# Patient Record
Sex: Female | Born: 1970
Health system: Southern US, Community
[De-identification: ages and names within clinical notes are randomized; demographics above are authoritative.]

## PROBLEM LIST (undated history)

## (undated) DIAGNOSIS — Z87440 Personal history of urinary (tract) infections: Secondary | ICD-10-CM

## (undated) DIAGNOSIS — Z141 Cystic fibrosis carrier: Secondary | ICD-10-CM

## (undated) DIAGNOSIS — N762 Acute vulvitis: Secondary | ICD-10-CM

## (undated) DIAGNOSIS — Z8619 Personal history of other infectious and parasitic diseases: Secondary | ICD-10-CM

## (undated) DIAGNOSIS — I10 Essential (primary) hypertension: Secondary | ICD-10-CM

## (undated) DIAGNOSIS — Z8269 Family history of other diseases of the musculoskeletal system and connective tissue: Secondary | ICD-10-CM

## (undated) DIAGNOSIS — R638 Other symptoms and signs concerning food and fluid intake: Secondary | ICD-10-CM

## (undated) DIAGNOSIS — T4145XA Adverse effect of unspecified anesthetic, initial encounter: Secondary | ICD-10-CM

## (undated) DIAGNOSIS — Z8249 Family history of ischemic heart disease and other diseases of the circulatory system: Secondary | ICD-10-CM

## (undated) DIAGNOSIS — IMO0002 Reserved for concepts with insufficient information to code with codable children: Secondary | ICD-10-CM

## (undated) DIAGNOSIS — E669 Obesity, unspecified: Secondary | ICD-10-CM

## (undated) DIAGNOSIS — E785 Hyperlipidemia, unspecified: Secondary | ICD-10-CM

## (undated) DIAGNOSIS — T8859XA Other complications of anesthesia, initial encounter: Secondary | ICD-10-CM

## (undated) HISTORY — DX: Family history of ischemic heart disease and other diseases of the circulatory system: Z82.49

## (undated) HISTORY — DX: Other complications of anesthesia, initial encounter: T88.59XA

## (undated) HISTORY — DX: Cystic fibrosis carrier: Z14.1

## (undated) HISTORY — PX: WISDOM TOOTH EXTRACTION: SHX21

## (undated) HISTORY — DX: Personal history of urinary (tract) infections: Z87.440

## (undated) HISTORY — DX: Reserved for concepts with insufficient information to code with codable children: IMO0002

## (undated) HISTORY — DX: Acute vulvitis: N76.2

## (undated) HISTORY — DX: Obesity, unspecified: E66.9

## (undated) HISTORY — DX: Hyperlipidemia, unspecified: E78.5

## (undated) HISTORY — DX: Personal history of other infectious and parasitic diseases: Z86.19

## (undated) HISTORY — DX: Adverse effect of unspecified anesthetic, initial encounter: T41.45XA

## (undated) HISTORY — DX: Essential (primary) hypertension: I10

## (undated) HISTORY — DX: Family history of other diseases of the musculoskeletal system and connective tissue: Z82.69

## (undated) HISTORY — DX: Other symptoms and signs concerning food and fluid intake: R63.8

---

## 2002-04-15 ENCOUNTER — Other Ambulatory Visit: Admission: RE | Admit: 2002-04-15 | Discharge: 2002-04-15 | Payer: Self-pay | Admitting: Obstetrics and Gynecology

## 2002-07-11 ENCOUNTER — Encounter: Payer: Self-pay | Admitting: Obstetrics and Gynecology

## 2002-07-11 ENCOUNTER — Ambulatory Visit (HOSPITAL_COMMUNITY): Admission: RE | Admit: 2002-07-11 | Discharge: 2002-07-11 | Payer: Self-pay | Admitting: Obstetrics and Gynecology

## 2002-07-25 ENCOUNTER — Ambulatory Visit (HOSPITAL_COMMUNITY): Admission: RE | Admit: 2002-07-25 | Discharge: 2002-07-25 | Payer: Self-pay | Admitting: Obstetrics and Gynecology

## 2002-07-25 ENCOUNTER — Encounter: Payer: Self-pay | Admitting: Obstetrics and Gynecology

## 2002-12-16 ENCOUNTER — Inpatient Hospital Stay (HOSPITAL_COMMUNITY): Admission: AD | Admit: 2002-12-16 | Discharge: 2002-12-20 | Payer: Self-pay | Admitting: Obstetrics and Gynecology

## 2002-12-17 ENCOUNTER — Encounter (INDEPENDENT_AMBULATORY_CARE_PROVIDER_SITE_OTHER): Payer: Self-pay

## 2002-12-21 ENCOUNTER — Encounter: Admission: RE | Admit: 2002-12-21 | Discharge: 2003-01-20 | Payer: Self-pay | Admitting: Obstetrics and Gynecology

## 2003-05-12 ENCOUNTER — Other Ambulatory Visit: Admission: RE | Admit: 2003-05-12 | Discharge: 2003-05-12 | Payer: Self-pay | Admitting: Obstetrics and Gynecology

## 2004-02-08 DIAGNOSIS — Z8619 Personal history of other infectious and parasitic diseases: Secondary | ICD-10-CM

## 2004-02-08 HISTORY — DX: Personal history of other infectious and parasitic diseases: Z86.19

## 2004-05-17 ENCOUNTER — Other Ambulatory Visit: Admission: RE | Admit: 2004-05-17 | Discharge: 2004-05-17 | Payer: Self-pay | Admitting: Obstetrics and Gynecology

## 2005-02-07 DIAGNOSIS — R87619 Unspecified abnormal cytological findings in specimens from cervix uteri: Secondary | ICD-10-CM

## 2005-02-07 DIAGNOSIS — IMO0002 Reserved for concepts with insufficient information to code with codable children: Secondary | ICD-10-CM

## 2005-02-07 HISTORY — DX: Reserved for concepts with insufficient information to code with codable children: IMO0002

## 2005-02-07 HISTORY — DX: Unspecified abnormal cytological findings in specimens from cervix uteri: R87.619

## 2005-03-18 ENCOUNTER — Other Ambulatory Visit: Admission: RE | Admit: 2005-03-18 | Discharge: 2005-03-18 | Payer: Self-pay | Admitting: Obstetrics and Gynecology

## 2006-04-27 DIAGNOSIS — N762 Acute vulvitis: Secondary | ICD-10-CM

## 2006-04-27 HISTORY — DX: Acute vulvitis: N76.2

## 2007-06-25 ENCOUNTER — Ambulatory Visit (HOSPITAL_COMMUNITY): Admission: RE | Admit: 2007-06-25 | Discharge: 2007-06-25 | Payer: Self-pay | Admitting: Obstetrics and Gynecology

## 2007-06-25 ENCOUNTER — Encounter (INDEPENDENT_AMBULATORY_CARE_PROVIDER_SITE_OTHER): Payer: Self-pay | Admitting: Obstetrics and Gynecology

## 2007-06-25 ENCOUNTER — Ambulatory Visit: Payer: Self-pay | Admitting: *Deleted

## 2007-07-06 ENCOUNTER — Ambulatory Visit (HOSPITAL_COMMUNITY): Admission: RE | Admit: 2007-07-06 | Discharge: 2007-07-06 | Payer: Self-pay | Admitting: Obstetrics and Gynecology

## 2007-08-10 ENCOUNTER — Inpatient Hospital Stay (HOSPITAL_COMMUNITY): Admission: AD | Admit: 2007-08-10 | Discharge: 2007-08-10 | Payer: Self-pay | Admitting: Obstetrics and Gynecology

## 2007-08-13 ENCOUNTER — Other Ambulatory Visit: Payer: Self-pay | Admitting: Obstetrics and Gynecology

## 2007-08-13 ENCOUNTER — Inpatient Hospital Stay (HOSPITAL_COMMUNITY): Admission: AD | Admit: 2007-08-13 | Discharge: 2007-08-16 | Payer: Self-pay | Admitting: Obstetrics and Gynecology

## 2010-04-22 IMAGING — US US FETAL BPP W/O NONSTRESS
1 series · 14 of 15 positions shown · non-contrast
Comparison: none

OBSTETRICAL ULTRASOUND:
 This ultrasound exam was performed in the [HOSPITAL] Ultrasound Department.  The OB US report was generated in the AS system, and faxed to the ordering physician.  This report is also available in [REDACTED] PACS.

[Series 1: us fetal bpp w/o nonstress · 0.30mm/px · 15 acquisitions, 14 frames shown]
[im 1/15]
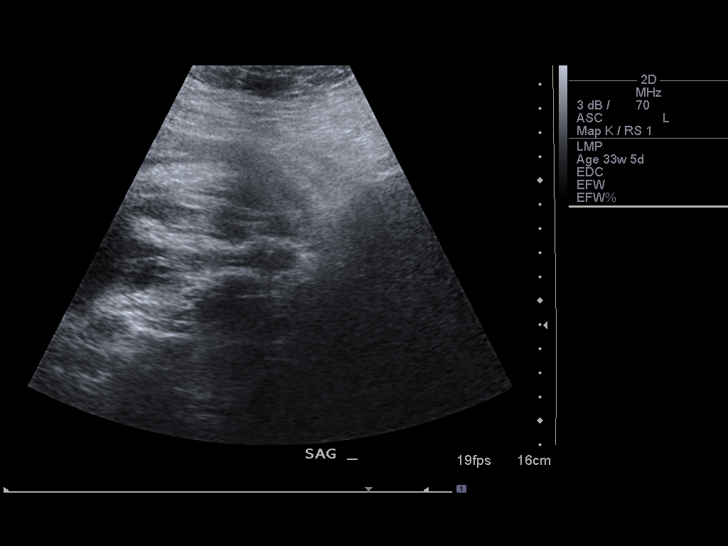
[im 2/15]
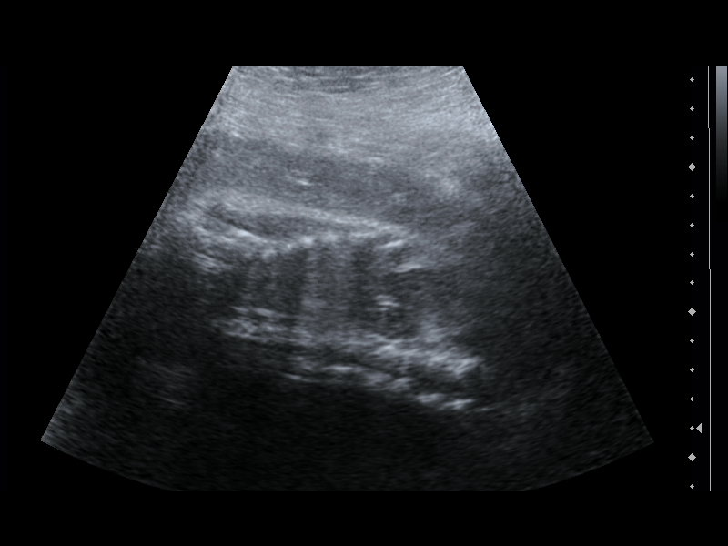
[im 3/15]
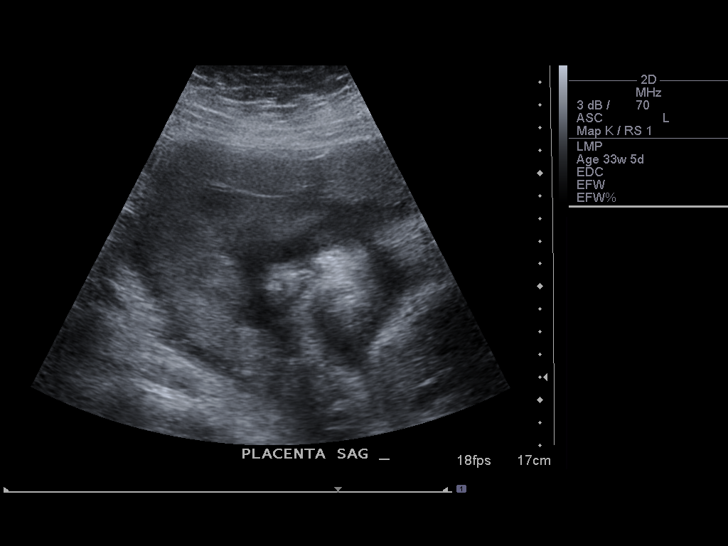
[im 4/15]
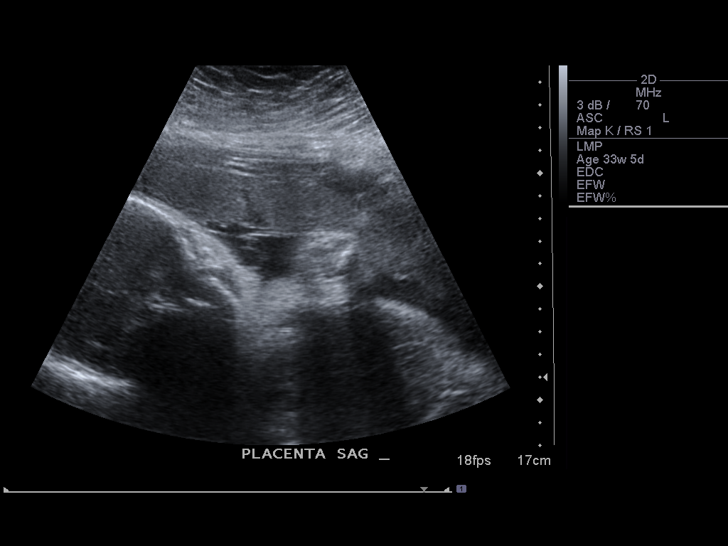
[im 5/15]
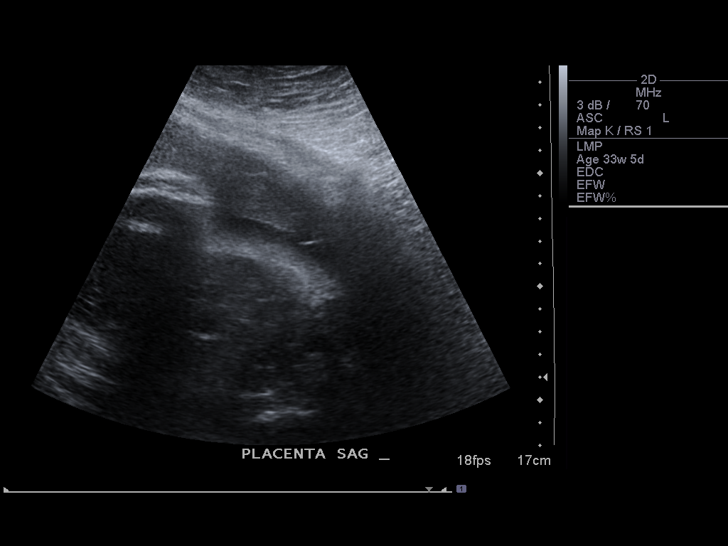
[im 6/15]
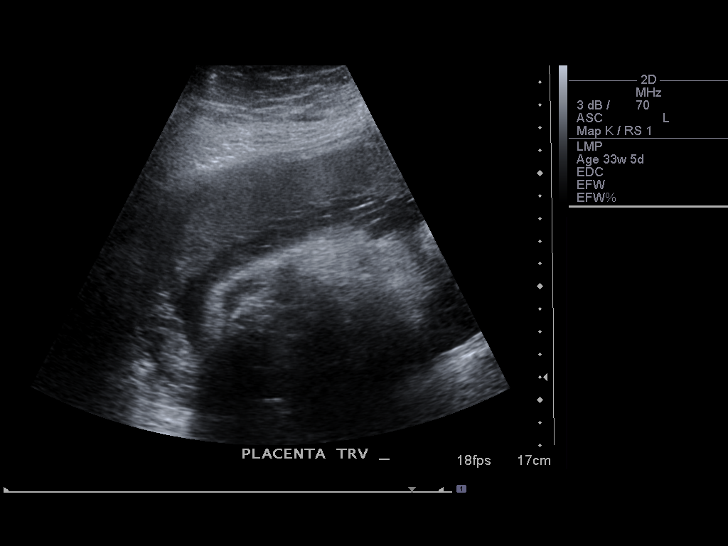
[im 7/15]
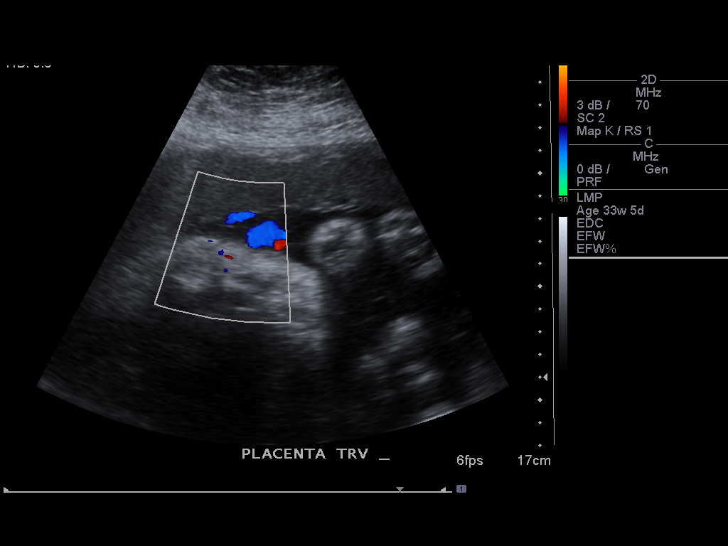
[im 9/15]
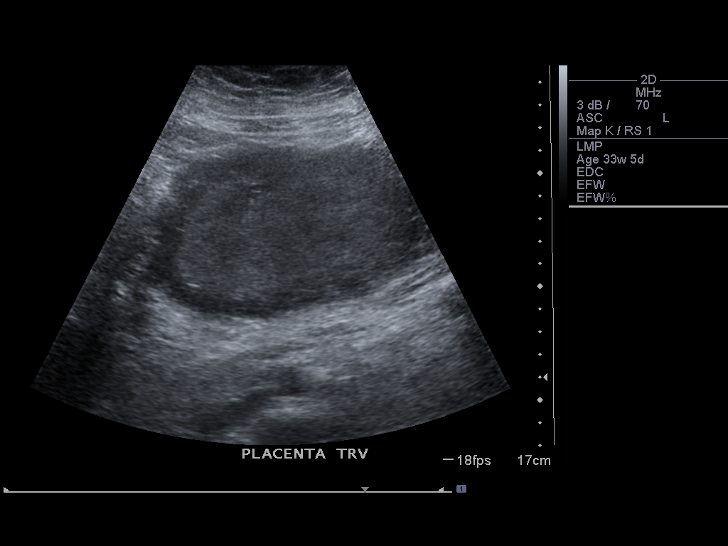
[im 10/15]
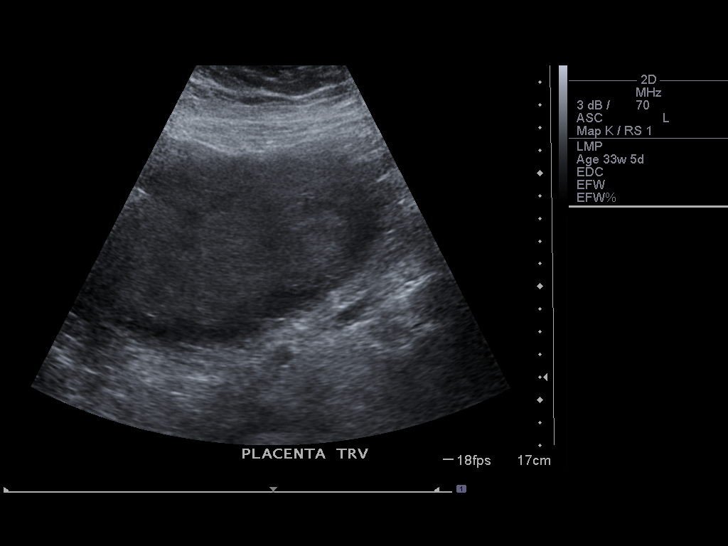
[im 11/15]
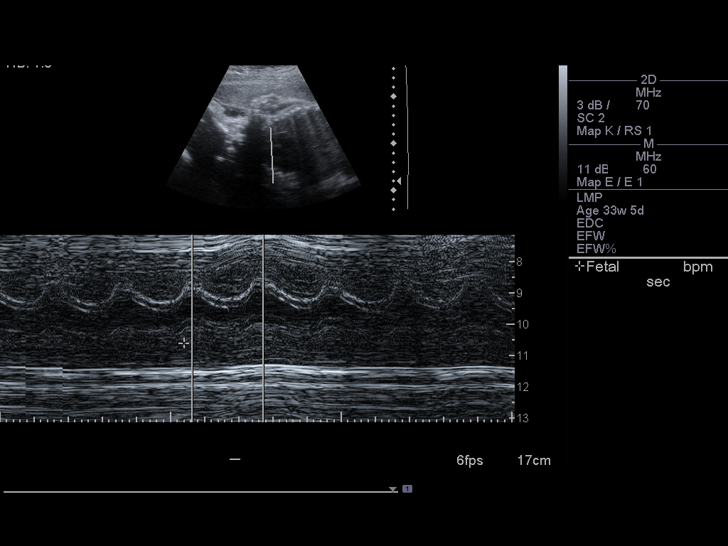
[im 12/15]
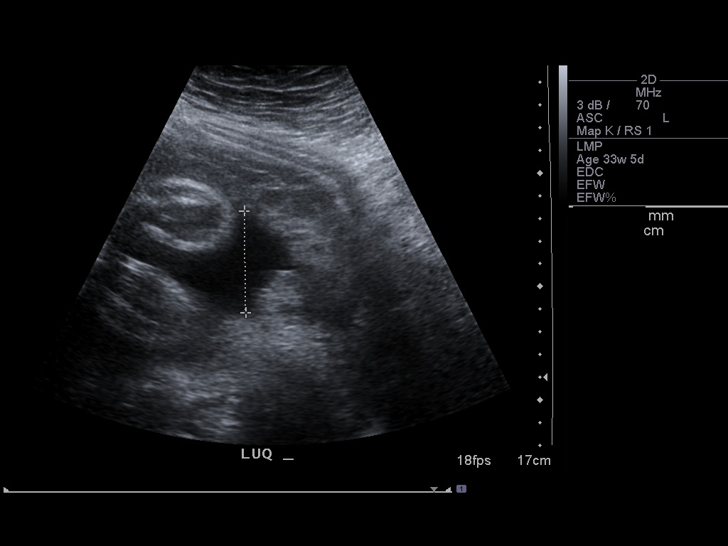
[im 13/15]
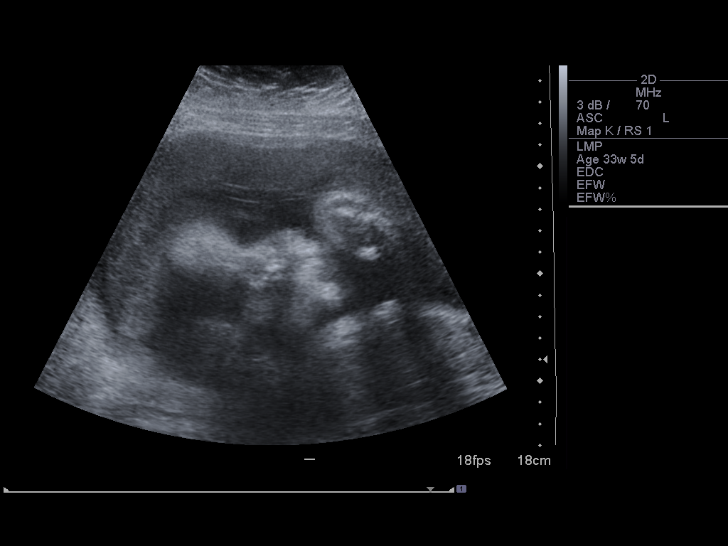
[im 14/15]
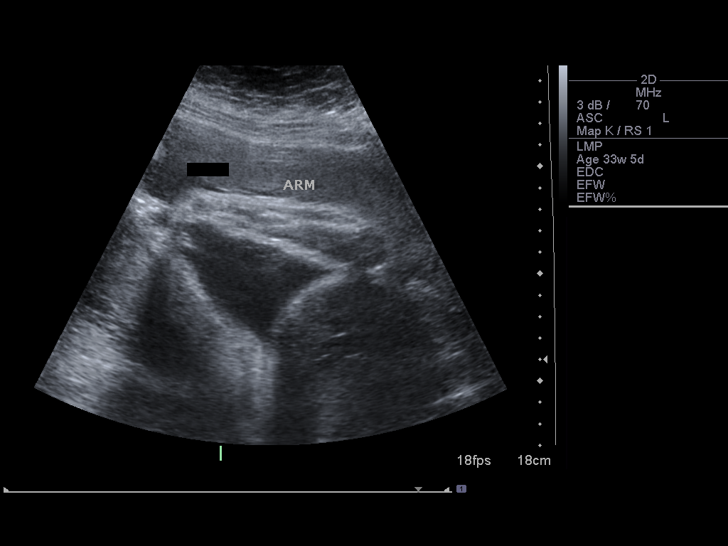
[im 15/15]
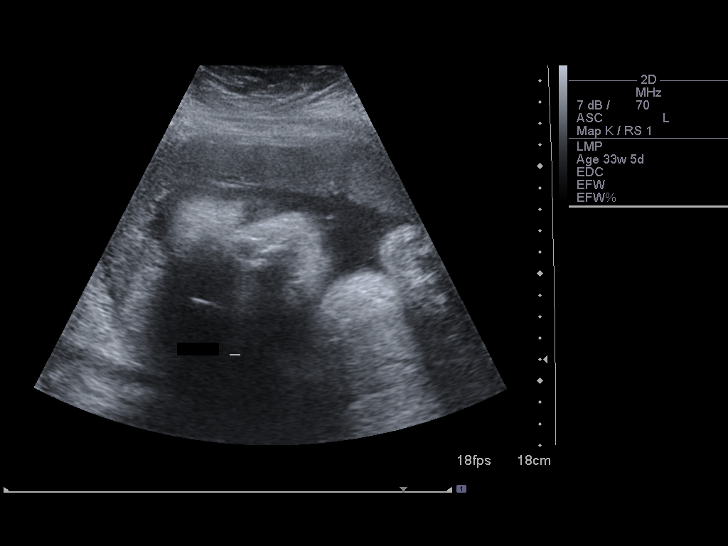

[14 of 15 positions shown; findings below may reference images not displayed]

IMPRESSION: See AS Obstetric US report.

## 2010-05-26 ENCOUNTER — Other Ambulatory Visit: Payer: Self-pay | Admitting: Obstetrics and Gynecology

## 2010-05-26 DIAGNOSIS — Z1231 Encounter for screening mammogram for malignant neoplasm of breast: Secondary | ICD-10-CM

## 2010-06-22 NOTE — H&P (Signed)
[name], [name]                ACCOUNT NO.:  [id number]   MEDICAL RECORD NO.:  [id number]          PATIENT TYPE:  MAT   LOCATION:  MATC                          [facility]   PHYSICIAN:  Hal Morales, M.D.DATE OF BIRTH:  [birth year]   DATE OF ADMISSION:  08/13/2007  DATE OF DISCHARGE:                              HISTORY & PHYSICAL   [name] is a 40 year old single black female gravida 3, para 1-0-1-1  at 39-1/7 weeks by an [facility] of August 19, 2007 who was sent to maternity  admissions unit secondary to nonreactive NST and elevated blood  pressure.  The patient was scheduled for a repeat low transverse  cesarean section on August 14, 2007.  Her blood pressures since arrival to  MAU have been in the range of systolic 134-151 over diastolic of 88-95.  She is without PIH signs or symptoms with exception of intermittent  visual disturbances.  She denies nausea, vomiting, diarrhea, UTI signs  or symptoms, shortness of breath, fever, cough, sore throat, or chest  pain.  She has been followed by M.D. service.  Dr. Su Hilt is her  primary physician at [facility].  Her history is remarkable for:  1. Chronic hypertension for which she has not been on any medications      during this pregnancy.  She had been previously prescribed      hydrochlorothiazide.  2. Previous C-section for nonreassuring fetal heart tracing with      desire for repeat.  3. 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  4. Increased BMI.  5. History of STD.  6. History of abnormal Pap/   PRENATAL LABORATORY DATA:  Which were drawn January 18, 2007;  hemoglobin at that time was 12.4, platelets were 346, blood type is B+,  Rh antibody screen negative.  Sickle cell trait negative.  RPR  nonreactive, rubella titer immune.  Hepatitis surface antigen negative.  HIV nonreactive.  Gonorrhea and chlamydia cultures negative.  Again  positive CF carrier.   OBSTETRICAL  HISTORY:  She had an induced abortion with her first  pregnancy in 1988 at approximately [redacted] weeks gestation in [place]  without complications.  Gravida 2 was a C-section at approximately 40  weeks, female infant in November of 2004 weighing 8 pounds 4 ounces, named  Duwayne Heck, secondary to nonreassuring fetal heart tracing by Dr. Osborn Coho, and gravida 3 is current pregnancy.   ALLERGIES:  SHE SAYS PERCOCET CAUSES NAUSEA AND VOMITING.  Denies latex  or other sensitivities.   PAST MEDICAL HISTORY:  She reports menarche at 40 years of age with  monthly cycle, no abnormalities.  She reports an LMP of November 03, 2006, giving her an [facility] of August 11, 2007.  However, she had an ultrasound  for size less than dates on January 18, 2008 giving best [facility] of August 19, 2007.  She reports mild blood pressure elevation with her previous  pregnancy and was on blood pressure medication her third trimester.  Reports use of birth  control pills and NuvaRing in the past.  She had an  abnormal Pap smear in 2007 with a colposcopy done which was normal.  Last Pap in March 2008 was normal.  Treated for chlamydia in 2006.  Does  have a history of past frequent yeast infections but none recently.  She  reports chicken pox as a child and normal childhood illnesses.   SURGICAL HISTORY:  1. Previous C-section for nonreassuring fetal heart tracing November      2004.  2. Wisdom teeth excised 2000.   GENETIC HISTORY:  Remarkable for advanced maternal age as well as a  positive cystic fibrosis carrier with father of baby being negative on  her chart.   FAMILY HISTORY:  Remarkable for mom having a stent for heart disease.  Mom and her sister as well have high blood pressure.  She said she has  first cousins with lung disease, unsure specifics, does use oxygen.  Mom  is a type 2 diabetic.  Maternal uncle alcoholic; he is now deceased.   SOCIAL HISTORY:  She is a single black female, does not report a   religious affiliation.  Father of baby's name is not on the chart but is  same father of the baby as her older son.  She works full-time for  [employer].  Denies alcohol, tobacco or illicit drug use.   HISTORY OF PRESENT PREGNANCY:  She entered care December 4 for her new  OB interview at approximately 9-6/7 weeks.  She returned on December 11  for her new OB workup and had an ultrasound for size less than dates,  and appropriate gestational age was 9-4/7 weeks with best [facility] of August 19, 2007.  She did have a urine culture at that time that was negative.  Blood pressures at her new OB visit were within normal limits.  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  She had a first trimester screen at approximately 12-  3/7 weeks which was in normal limits.  She had a follow-up AFP which was  also within normal limits at approximately 15-4/7 weeks.  She had an  anatomy ultrasound at 19-5/7 weeks which showed normal growth and  development, normal fluid, three-vessel cord, suggestive female, plans to  name the baby Harrold Donath.  She did have 2+ glucosuria at 19-5/7 weeks, and  dextrose strip was 120.  She had her 1-hour GTT the following week which  was rescheduled because of the high sugar and result of 148.  Hemoglobin  at that time was 11.5.  Urine culture at that time was negative.  She  was scheduled for 3-hour GTT which subsequently was within normal  limits.  She had a ultrasound secondary to chronic hypertension at  approximately 27-4/7 weeks for growth.  Estimated fetal weight was 2  pounds 10 ounces, 84th percentile.  Cervix was 3.48 cm.  AFI 19.3.  Was  considering at that time a tubal and is since still undecided, so no  plans have been made beyond that.  She did voice desire though for  repeat C-section which was then scheduled for July 7.  She was measuring  size greater than dates at approximately 30 weeks.  She was measuring   about 3 weeks ahead.  Plan was made to have an ultrasound at  approximately 32 weeks which did showed estimated fetal weight 4 pounds  3 ounces, 63rd percentile, normal fluid, was frank breech presentation.  The patient was having complaints of significant lower extremity edema  but no calf tenderness.  She did have a third trimester Doppler of her  lower extremities which was subsequently negative for DVT.  Was  encouraged to obtained TED hose.  The patient was followed with twice  weekly NSTs and/or BPPs in her third trimester secondary to her chronic  hypertension, had had times when it was nonreactive and was sent for  extended monitoring at maternity admission unit, and otherwise the  patient's pregnancy progressed without any other complications until the  elevated blood pressure today, and the patient would desire to proceed  with C-section on July 6.   OBJECTIVE:  As noted with her blood pressures in HPI, other vital signs  were stable.  She is afebrile.  HEENT:  Grossly intact and within normal limits.  She does have  contacts.  CARDIOVASCULAR:  Regular rate and rhythm without murmur.  LUNGS:  Clear to auscultation bilaterally.  ABDOMEN:  Is soft, nontender, gravid.  No rebound, no guarding.  PELVIC EXAM:  Was deferred.  EFM showed a baseline of 140, moderate  variability overall and reactive.  She has an occasional contraction  which is mild on palpation.  EXTREMITIES:  3+ pitting edema, bilateral lower extremities.  No clonus.  DTRs are 1+, negative Homans' sign.   IMPRESSION:  1. Intrauterine pregnancy at 39-1/7 weeks.  2. Desires repeat low transverse cesarean section and also indication      secondary to breech presentation.  3. Chronic hypertension.  4. Undecided on bilateral tubal ligation and no plan to proceed with      that procedure.   PLAN:  All questions have been answered by Dr. Pennie Rushing at bedside.  The  patient to proceed with C-section and will be prepped  and taken to the  OR this evening.  She had had some crackers and juice at approximately  11:30 at the office, and so time delay for solid ingestion.      Candice [place], [place]      Hal Morales, M.D.  Electronically Signed    CHS/MEDQ  D:  08/13/2007  T:  08/13/2007  Job:  [id number]

## 2010-06-22 NOTE — Discharge Summary (Signed)
NAMESHABRE, [name]                ACCOUNT NO.:  [id number]   MEDICAL RECORD NO.:  [id number]          PATIENT TYPE:  INP   LOCATION:  9126                          [facility]   PHYSICIAN:  Crist Fat. Rivard, M.D. DATE OF BIRTH:  1970-08-11   DATE OF ADMISSION:  08/13/2007  DATE OF DISCHARGE:  08/16/2007                               DISCHARGE SUMMARY   ADMITTING DIAGNOSES:  1. Intrauterine pregnancy at 39 weeks.  2. Chronic high blood pressure.  3. Obesity class III.  4. Breech presentation.  5. Body mass index #48.  6. Prior cesarean section with desire for repeat.   DISCHARGE DIAGNOSES:  1. Intrauterine pregnancy at 39 weeks.  2. Chronic high blood pressure.  3. Obesity class III.  4. Breech presentation.  5. Body mass index #48.  6. Prior cesarean section with desire for repeat.   PROCEDURES:  Repeat low transverse cesarean section.   HOSPITAL COURSE:  [name] is a 40 year old gravida 3, para 1-0-1-1 at  39-1/7 weeks who was evaluated at maternity admissions unit on the  evening of August 13, 2007, secondary to a nonreactive NST and elevated  blood pressure.  She was scheduled for a repeat cesarean section on August 14, 2007.  In maternity admission, she had some mild elevations of her  blood pressure.  Infant was in a breech presentation.  She was having  some occasional contractions.  The decision was made to proceed with  cesarean section that evening.  The patient was taken to the operating  room where a repeat low transverse cesarean section was performed by Dr.  Pennie Rushing with findings of a viable female by the name of [name], weight 8  pounds.  Apgars were 7 and 9.   The patient's pregnancy had been remarkable for:  1. Chronic hypertension for which she had not required any      medications.  She had previously been on hydrochlorothiazide prior      to pregnancy.  2. Previous cesarean section for nonreassuring fetal heart rate with      desire for repeat.  3.  Positive cystic fibrosis carrier and father of baby being negative.  4. Increased BMI.  5. History of STD.  6. History of abnormal Pap.   The patient tolerated the procedure well and was taken to recovery in  good condition.  Infant was taken to the full-term nursery.  On the  evening of her surgery, she did have episodes of low temperature down to  a nadir of 95.9 rectally.  Bear Hugs gown was placed and her temperature  stabilized.  She maintained normal vital signs.  Her blood pressure was  within normal limits.  Her hemoglobin on day #1 was 11.7, white blood  cell count 15.7 and platelet count was 253.  She did have a JP drain  that drained small amount throughout her hospitalization.  She was using  Darvocet secondary to a sensitivity to Percocet.  She attempted to  breast feed;  however, her nipples became very sore then she elected to  probably pump for breast milk.  She also had an inverted nipple on the  right.  By postop day #3, she was doing well.  She was up ad lib.  She  was tolerating a regular diet.  Pain medication was controlling for  comfort without difficulty.  Her incision was clean, dry, and intact.  Her JP drain was removed without difficulty.  She elected to use Depo-  Provera 150 mg IM on discharge and then her plan is to use a [place]  subsequently.  The patient was deemed to have received benefit from her  hospital stay and was discharged home.   DISCHARGE INSTRUCTIONS:  Per [facility] handout.   DISCHARGE MEDICATIONS:  1. Motrin 600 mg p.o. q.6 h p.r.n. pain.  2. Darvocet 1-2 p.o. q 3-4 h p.r.n. pain.   Discharge followup will occur in 6 weeks at [facility].      Renaldo Reel Emilee Hero, C.N.M.      Crist Fat Rivard, M.D.  Electronically Signed    VLL/MEDQ  D:  08/16/2007  T:  08/16/2007  Job:  [id number]

## 2010-06-22 NOTE — Op Note (Signed)
[name], [name]                ACCOUNT NO.:  [id number]   MEDICAL RECORD NO.:  [id number]          PATIENT TYPE:  INP   LOCATION:  9126                          [facility]   PHYSICIAN:  Hal Morales, M.D.DATE OF BIRTH:  04/03/1970   DATE OF PROCEDURE:  DATE OF DISCHARGE:                               OPERATIVE REPORT   PREOPERATIVE DIAGNOSES:  Intrauterine pregnancy at [redacted] weeks gestation,  chronic hypertension, class III obesity with a body mass index of 48,  breech presentation, prior cesarean section with desire for repeat.   POSTOPERATIVE DIAGNOSES:  Intrauterine pregnancy at [redacted] weeks gestation,  chronic hypertension, class III obesity with a body mass index of 48,  breech presentation, prior cesarean section with desire for repeat plus  small uterine fibroid.   PROCEDURE:  Repeat low transverse cesarean section.   SURGEON:  Hal Morales, MD   FIRST ASSISTANT:  Candice Denny Levy, CNM, certified nurse midwife.   ANESTHESIA:  Spinal.   ESTIMATED BLOOD LOSS:  750 mL.   COMPLICATIONS:  None.   FINDINGS:  The patient delivered a female infant whose name is Harrold Donath,  weighing 8 pounds with Apgars of 7 at 9 and 1 at 5 minutes respectively.  The placenta contained an eccentrically inserted three-vessel cord.  The  uterus contained a 2-cm exophytic pedunculated fibroid on the anterior  fundus.  The tubes and ovaries were normal for the gravid state.   PROCEDURE:  The patient was taken to the operating room after  appropriate identification, and after a discussion concerning the  indications for her procedure as well as the risks involved, which  include but are not limited to anesthesia, bleeding, infection and  damage to adjacent organs.  The patient was taken to the operating room  and placed on the operating table.  A spinal anesthetic was placed, and  she was placed in the supine position with a left lateral tilt.  The  pannus was then taped to the  anesthesia bar to allow access to the  suprapubic region.  The abdomen and perineum were prepped with multiple  layers of Betadine.  A Foley catheter was inserted into the bladder  under sterile conditions and connected to straight drainage.  The  abdomen was draped as a sterile field.  After assurance of adequate  surgical anesthesia, the suprapubic region was infiltrated with 30 mL of  0.25% Marcaine.  A transverse incision was made at the site of the  previous cesarean section incision, and the abdomen opened in layers.  The peritoneum was entered, and the bladder blade placed.  The uterus  was incised approximately 2 cm above the uterovesical fold and that  incision taken anteriorly and caudad to allow for egress of the  presenting part.  The presentation was right sacrum anterior.  The  breech was delivered into the incision, and then the legs delivered.  The infant was extracted with gentle traction down to the level of the  scapula and each arm delivered in turn across the body.  The infant was  elevated, and the head delivered with  gentle flexion.  The neck nares  and pharynx were then suctioned, and the cord clamped and cut.  The  infant was handed off to the awaiting pediatricians.  The appropriate  cord blood was drawn, and the placenta allowed to separate from the  uterus then was removed from the operative field.  The uterine incision  was closed with a running interlocking suture of 0 Vicryl.  An  imbricating suture of 0 Vicryl was placed.  Two figure-of-eight sutures  of 0 Vicryl allowed for adequate hemostasis.  Copious irrigation was  carried out.  The abdominal peritoneum was closed with a running suture  of 2-0 Vicryl.  The rectus muscles were reapproximated in the midline  with a figure-of-eight suture of 0 Vicryl.  The fascia was closed with a  running suture of 0 Vicryl then reinforced on either side of midline  with figure-of-eight sutures of 0 Vicryl.  The  subcutaneous tissue was  copiously irrigated and made hemostatic with Bovie cautery.  A  subcutaneous Jackson-Pratt drain was placed through a stab wound in the  left lower quadrant and sewn in with a suture of 0 silk.  Skin incision  was closed with a subcuticular suture of 3-0 Monocryl.  Steri-Strips and  a sterile dressing were then applied.  The patient was taken from the  operating room to the recovery room in satisfactory condition, having  tolerated the procedure well with sponge and instrument counts correct.  The infant went to the full-term nursery.      Hal Morales, M.D.  Electronically Signed     VPH/MEDQ  D:  08/13/2007  T:  08/14/2007  Job:  [id number]

## 2010-06-23 ENCOUNTER — Ambulatory Visit
Admission: RE | Admit: 2010-06-23 | Discharge: 2010-06-23 | Disposition: A | Discharge status: Home / Self Care | Payer: 59 | Source: Ambulatory Visit | Attending: Obstetrics and Gynecology | Admitting: Obstetrics and Gynecology

## 2010-06-23 DIAGNOSIS — Z1231 Encounter for screening mammogram for malignant neoplasm of breast: Secondary | ICD-10-CM

## 2010-06-25 NOTE — Discharge Summary (Signed)
NAMEANGELES, ZEHNER                          ACCOUNT NO.:  [id number]   MEDICAL RECORD NO.:  [id number]                   PATIENT TYPE:  INP   LOCATION:  9142                                 [facility]   PHYSICIAN:  Naima A. Dillard, M.D.              DATE OF BIRTH:  22-Dec-1970   DATE OF ADMISSION:  12/16/2002  DATE OF DISCHARGE:                                 DISCHARGE SUMMARY   ADMITTING DIAGNOSES:  1. Intrauterine pregnancy at 40 and four-sevenths weeks.  2. Pregnancy-induced hypertension without evidence of preeclampsia.  3. Unfavorable cervix.   DISCHARGE DIAGNOSES:  1. Term intrauterine pregnancy.  2. Pregnancy-induced hypertension.  3. Nonreassuring fetal heart rate.   PROCEDURES:  Primary low transverse cesarean section.   HOSPITAL COURSE:  [name] is a 40 year old gravida 2 para 0-0-1-0 at 48  and four-sevenths weeks who was admitted on December 16, 2002 secondary to  elevated blood pressures.  She had been placed on [place] over the last one  to two weeks but blood pressure remained elevated.  Her pregnancy was also  remarkable for:  1. Elevated blood pressure on [place].  2. Obesity.  3. Cystic fibrosis carrier.   On admission cervix was fingertip, 50%, -2.  Blood pressures were in the  150s to 160s over 90s to 100s.  Fetal heart rate was reactive and  reassuring.  There were no uterine contractions.  Her PIH labs were within  normal limits.  She had no urine protein.  She was elected to proceed with  induction.  Cytotec was placed through the night.  She had spontaneous  rupture of membranes at approximately 3:10 a.m. with clear fluid noted.  She  then began to have sporadic variable decelerations.  Cervix was 3 cm, 95%,  vertex, at a -1 station.  Internal monitors were placed.  Amnioinfusion was  begun.  Epidural was placed for comfort.  The patient then had some late  decelerations at approximately 6:40 a.m.  She was given ephedrine, fluids,  and  terbutaline.  These did resolve.  She began to have a series of late  decelerations again at approximately 8:40 a.m., resolving to mild variables.  The cervix then progressed to 8 cm fairly quickly.  She did have variables  that responded to scalp stimulation.  Decent variability was noted.  She  then progressed.  By 10 a.m. she was a right anterior lip with the vertex at  a +1 station but then she began to have severe variables with no resolution.  Decision was made to proceed with C-section.  The patient was taken to the  operating room where a primary low transverse cesarean section was performed  by Dr. Su Hilt under epidural anesthesia.  Findings were a viable female by  the name of Jackelyn Hoehn.  Weight was 8 pounds 4 ounces.  Apgars were 8 and 9.  Cord gases were arterial 7.16, venous  7.22.  The patient tolerated the  procedure well.  Infant was taken to the full-term nursery.  By  postoperative day #1 the patient was doing well.  She was breastfeeding.  She had elected to defer contraception decisions.  Her blood pressures were  within normal limits.  Hemoglobin was 10.9, down from 12.6.  She had  positive bowel sounds.  Her incision was clean, dry, and intact.  The rest  of her hospital course was uncomplicated although she did maintain some  significant edema of her lower extremities.  Her blood pressure remained  stable.  By postoperative day #3 she was having minimal milk production and  was cup-feeding the infant and using nipple shields.  Her abdomen was soft  and nontender.  She did have panniculus noted with slight peau d'orange skin  noted in the lower abdomen.  There was no evidence of cellulitis.  Staples  were clean, dry, and intact.  Deep tendon reflexes were 2+ without clonus.  There was 2+ edema noted in the lower extremities.  Weight was 287 which was  essentially unchanged.  The patient was deemed to have received full benefit  of her hospital stay and was discharged home.   Election was made to leave  the staples in to be removed one week after delivery.   DISCHARGE MEDICATIONS:  1. Motrin 600 mg p.o. q.6h. p.r.n. pain.  2. Tylox one to two p.o. q.3-4h. p.r.n. pain.  3. Prenatal vitamin one p.o. daily.   FOLLOW-UP:  Discharge follow-up will occur on December 24, 2002 at 11:30  a.m. for staple removal and then at six weeks postpartum.  Lactation consult  was held before she went home.     Renaldo Reel Emilee Hero, C.N.M.                   Naima A. Normand Sloop, M.D.    Leeanne Mannan  D:  12/20/2002  T:  12/20/2002  Job:  [id number]

## 2010-06-25 NOTE — Op Note (Signed)
NAMETEALA, [name]                          ACCOUNT NO.:  [id number]   MEDICAL RECORD NO.:  [id number]                   PATIENT TYPE:  INP   LOCATION:  9142                                 FACILITY:  [facility]   PHYSICIAN:  Osborn Coho, M.D.                DATE OF BIRTH:  October 18, 1970   DATE OF PROCEDURE:  12/17/2002  DATE OF DISCHARGE:                                 OPERATIVE REPORT   PREOPERATIVE DIAGNOSES:  1. Term intrauterine pregnancy.  2. Pregnancy-induced hypertension.  3. Induction, in labor.  4. Nonreassuring fetal heart tracing.   POSTOPERATIVE DIAGNOSES:  1. Term intrauterine pregnancy.  2. Pregnancy-induced hypertension.  3. Induction, in labor.  4. Nonreassuring fetal heart tracing.   PROCEDURE:  Primary low transverse cesarean section via Pfannenstiel skin  incision.   ANESTHESIA:  Epidural.   ATTENDING:  Osborn Coho, M.D.   ASSISTANT:  Renaldo Reel. Emilee Hero, C.N.M.   FLUIDS REPLACED:  2000 mL.   URINE OUTPUT:  300 mL.   ESTIMATED BLOOD LOSS:  800 mL.   COMPLICATIONS:  None.   FINDINGS:  A live female infant with Apgars of 8 at one minute and 9 at five  minute.  Cord gases arterial 7.16 with a base excess of -7.9 and venous gas  of 7.22.   Placenta to pathology.   DESCRIPTION OF PROCEDURE:  The patient was taken to the operating room after  the risks, benefits, and alternatives were discussed with the patient.  The  patient verbalized understanding and consent signed and witnessed.  The  patient was given a surgical level via the epidural and prepped and draped  in the normal sterile fashion.  A Pfannenstiel skin incision was made and  carried down to the underlying layer of fascia with the knife.  The fascia  was then incised with the knife bilaterally and extended bilaterally with  the Mayo scissors.  The muscles were separated in the midline and the  peritoneum was entered bluntly.  A bladder flap was created with the  Metzenbaum scissors and a  bladder retractor placed.  The uterine incision  was made with the knife and extended bilaterally with the bandage scissors.  The infant was in the left occiput anterior position and the head was  delivered without difficulty.  Upon delivery of the head the oropharynx and  nasopharynx were bulb-suctioned.  The remainder of the infant was delivered,  the cord was clamped and cut, and the infant handed to the waiting  pediatricians.  Cord gases and cord bloods were sent.  The placenta was  removed via fundal massage and sent to pathology.  Cefoxitin was  administered after delivery of the infant.  The uterus was exteriorized and  cleared of all clots and debris.  The uterine incision was repaired with 0  Vicryl in a running locked fashion.  A second imbricating layer was  performed.  The bilateral  ovaries and bilateral fallopian tubes were noted  to be normal.  The uterus was returned to the intra-abdominal cavity.  The  intra-abdominal cavity was copiously irrigated and the gutters were cleared  of all clots and debris.  The uterine incision was noted to be hemostatic.  The peritoneum was closed with 3-0 chromic.  The fascia was repaired with 0  Vicryl in a running fashion.  The subcutaneous tissue was copiously  irrigated and made hemostatic with the Bovie.  The skin was closed with  staples.  A pressure dressing was placed.  Sponge, lap, and needle count was  correct.  The patient tolerated the procedure well and was returned to the  recovery room in stable condition.                                               Osborn Coho, M.D.    AR/MEDQ  D:  12/17/2002  T:  12/18/2002  Job:  [id number]

## 2010-06-25 NOTE — H&P (Signed)
[name], [name]                          ACCOUNT NO.:  [id number]   MEDICAL RECORD NO.:  [id number]                   PATIENT TYPE:  INP   LOCATION:  9161                                 FACILITY:  [facility]   PHYSICIAN:  Crist Fat. Rivard, M.D.              DATE OF BIRTH:  18-Jan-1971   DATE OF ADMISSION:  12/16/2002  DATE OF DISCHARGE:                                HISTORY & PHYSICAL   Ms. [name] is a 40 year old single black female, gravida 2, para 0-0-1-0, at  40-4/7 weeks, who presents from the office for evaluation secondary to  elevated blood pressures noted in the office and currently being on Aldomet.  She denies any headaches or nausea or vomiting or visual disturbances.  She  denies any right upper quadrant pain.  She has noted increased swelling over  the weekend.  She was noted to have elevated blood pressures on Friday and  was started on Aldomet 500 mg b.i.d. at that time.  Her pregnancy has been  followed at [facility] by the certified nurse midwife service  and has been essentially uncomplicated though recently at risk for:   1. This recent elevation of blood pressure, now on Aldomet.  2. Obesity.  3. Cystic fibrosis carrier.   She did have an ultrasound on November 5 and was noted that her infant was  in the 75th-95th percentile of growth.   OBSTETRICAL/GYNECOLOGIC HISTORY:  She is a gravida 2, para 0-0-1-0, had an  elective AB back in 1988 with no complications.  She has no other GYN  complaints.   GENERAL MEDICAL HISTORY:  She has no known drug allergies.   She reports having the usual childhood diseases.  She has occasional urinary  tract infections, and her only surgery was her wisdom teeth removed in her  66s.   FAMILY HISTORY:  Significant for mother with heart disease and hypertension  and diabetes, paternal grandmother with Alzheimer's, and uncle with  alcoholism.   GENETIC HISTORY:  Negative.   SOCIAL HISTORY:  She is single.  The  father of the baby is [name].  He  is not involved with her pregnancy, and he did not get a cystic fibrosis  screen.  She is employed full-time.  She is of the [place] faith.  They  deny any illicit drug use, alcohol, or smoking with this pregnancy.   PRENATAL LABORATORY DATA:  Her blood type is B positive, her antibody screen  is negative.  Sickle cell trait is negative.  Syphilis is nonreactive.  Rubella is immune.  Hepatitis B surface antigen is negative.  HIV is  nonreactive.  GC and Chlamydia are both negative.  Pap was within normal  limits.  She was noted to be a positive carrier for cystic fibrosis, and her  maternal serum quad screen was negative.  Her 36-week beta strep was  negative, and her most recent ultrasound  showed estimated fetal weight  between 75th and 90th percentile, AFI of 9.8.   PHYSICAL EXAMINATION:  VITAL SIGNS:  Her blood pressures today are 160/100,  168/98, and 163/99.  She is afebrile.  HEENT:  Grossly within normal limits.  CARDIAC:  Her heart is regular rhythm and rate.  CHEST:  Clear.  BREASTS:  Soft and nontender.  ABDOMEN:  Gravid with reactive and reassuring fetal heart rate.  No regular  uterine contractions noted.  PELVIC:  Her cervix is fingertip, 50%, vertex, and -2.  EXTREMITIES:  2+ edema, 3+ reflexes, and no clonus.   Uric acid is 4.2, SGOT is 15, SGPT is less than 19, LDH is less than 105.  Platelets are 331.  Her clean catch urine was trace for protein at the  office.   ASSESSMENT:  1. Intrauterine pregnancy at 40-4/7 weeks.  2. Pregnancy-induced hypertension without evidence of preeclampsia.  3. Unfavorable cervix.   A plan was made in consultation with Dr. Estanislado Pandy, and the patient was offered  admission for induction or observation at home with increased Aldomet.  The  patient elected to proceed with induction of labor.  The risks and benefits  of the same, including failure to progress, fetal distress, and increased  risk of  cesarean section, were discussed with the patient.  The patient  continued to desire to proceed with induction of labor.  We will plan to  place Cytotec for cervical ripening.     Concha Pyo. Duplantis, C.N.M.              Crist Fat Rivard, M.D.    SJD/MEDQ  D:  12/16/2002  T:  12/16/2002  Job:  [id number]

## 2010-11-04 LAB — PROTEIN, URINE, 24 HOUR
Collection Interval-UPROT: 24
Protein, Urine: 5
Urine Total Volume-UPROT: 2025

## 2010-11-04 LAB — CREATININE, URINE, 24 HOUR
Creatinine, 24H Ur: 2088 — ABNORMAL HIGH
Creatinine, Urine: 103.1
Urine Total Volume-UCRE24: 2025

## 2010-11-04 LAB — CBC
MCHC: 32.7
MCV: 77.7 — ABNORMAL LOW
Platelets: 253
Platelets: 290
RDW: 15.1
RDW: 15.2

## 2010-11-04 LAB — COMPREHENSIVE METABOLIC PANEL
ALT: 16
Albumin: 2.7 — ABNORMAL LOW
Calcium: 9.2
GFR calc Af Amer: >60
Glucose, Bld: 91
Sodium: 135
Total Protein: 6.3

## 2010-11-04 LAB — RPR
RPR Ser Ql: NONREACTIVE
RPR Ser Ql: NONREACTIVE

## 2011-06-29 ENCOUNTER — Encounter: Payer: Self-pay | Admitting: Obstetrics and Gynecology

## 2011-07-06 ENCOUNTER — Other Ambulatory Visit: Payer: Self-pay | Admitting: Obstetrics and Gynecology

## 2011-07-06 DIAGNOSIS — Z1231 Encounter for screening mammogram for malignant neoplasm of breast: Secondary | ICD-10-CM

## 2011-07-15 ENCOUNTER — Encounter: Payer: Self-pay | Admitting: Obstetrics and Gynecology

## 2011-07-15 ENCOUNTER — Ambulatory Visit (INDEPENDENT_AMBULATORY_CARE_PROVIDER_SITE_OTHER): Payer: 59 | Admitting: Obstetrics and Gynecology

## 2011-07-15 VITALS — BP 140/78 | Resp 18 | Ht 64.0 in | Wt 267.0 lb

## 2011-07-15 DIAGNOSIS — N898 Other specified noninflammatory disorders of vagina: Secondary | ICD-10-CM

## 2011-07-15 DIAGNOSIS — Z975 Presence of (intrauterine) contraceptive device: Secondary | ICD-10-CM

## 2011-07-15 DIAGNOSIS — N762 Acute vulvitis: Secondary | ICD-10-CM

## 2011-07-15 DIAGNOSIS — Z30431 Encounter for routine checking of intrauterine contraceptive device: Secondary | ICD-10-CM

## 2011-07-15 DIAGNOSIS — L293 Anogenital pruritus, unspecified: Secondary | ICD-10-CM

## 2011-07-15 DIAGNOSIS — N760 Acute vaginitis: Secondary | ICD-10-CM

## 2011-07-15 LAB — POCT WET PREP (WET MOUNT)

## 2011-07-15 MED ORDER — CLOTRIMAZOLE-BETAMETHASONE 1-0.05 % EX CREA: TOPICAL_CREAM | Freq: Every day | CUTANEOUS | Status: AC

## 2011-07-15 NOTE — Progress Notes (Signed)
S:wants strings checked and c/o ext itching with no obvious d/c Filed Vitals:   07/15/11 0928  BP: 140/78  Resp: 18   ROS: noncontributory  Pelvic exam:  VULVA: normal appearing vulva with no masses, tenderness or lesions,  VAGINA: normal appearing vagina with normal color and discharge, no lesions, CERVIX: normal appearing cervix without discharge or lesions,  UTERUS: uterus is normal size, shape, consistency and nontender,  ADNEXA: normal adnexa in size, nontender and no masses.  Nl appearing d/c, IUD string clearly visible Results for orders placed in visit on 07/15/11  POCT WET PREP (WET MOUNT)      Component Value Range   Source Wet Prep POC       WBC, Wet Prep HPF POC       Bacteria Wet Prep HPF POC neg     BACTERIA WET PREP MORPHOLOGY POC       Clue Cells Wet Prep HPF POC None     CLUE CELLS WET PREP WHIFF POC Negative Whiff     Yeast Wet Prep HPF POC None     KOH Wet Prep POC       Trichomonas Wet Prep HPF POC neg     pH 4.5      A/P Vulvitis - Lotrisone Wet prep neg IUD in place-due to come out per pt in 2014 Declined STD screen RTO for AEX 02/2012

## 2011-07-22 ENCOUNTER — Ambulatory Visit
Admission: RE | Admit: 2011-07-22 | Discharge: 2011-07-22 | Disposition: A | Discharge status: Home / Self Care | Payer: Self-pay | Source: Ambulatory Visit | Attending: Obstetrics and Gynecology | Admitting: Obstetrics and Gynecology

## 2011-07-22 DIAGNOSIS — Z1231 Encounter for screening mammogram for malignant neoplasm of breast: Secondary | ICD-10-CM

## 2012-09-24 ENCOUNTER — Encounter: Payer: Self-pay | Admitting: Family Medicine

## 2012-09-24 ENCOUNTER — Ambulatory Visit: Payer: Self-pay | Admitting: Family Medicine

## 2012-09-24 ENCOUNTER — Ambulatory Visit (INDEPENDENT_AMBULATORY_CARE_PROVIDER_SITE_OTHER): Payer: 59 | Admitting: Family Medicine

## 2012-09-24 VITALS — BP 137/80 | HR 82 | Temp 97.7°F | Ht 65.0 in | Wt 277.0 lb

## 2012-09-24 DIAGNOSIS — Z119 Encounter for screening for infectious and parasitic diseases, unspecified: Secondary | ICD-10-CM | POA: Insufficient documentation

## 2012-09-24 DIAGNOSIS — R600 Localized edema: Secondary | ICD-10-CM | POA: Insufficient documentation

## 2012-09-24 DIAGNOSIS — Z8619 Personal history of other infectious and parasitic diseases: Secondary | ICD-10-CM | POA: Insufficient documentation

## 2012-09-24 DIAGNOSIS — Z8249 Family history of ischemic heart disease and other diseases of the circulatory system: Secondary | ICD-10-CM | POA: Insufficient documentation

## 2012-09-24 DIAGNOSIS — E1159 Type 2 diabetes mellitus with other circulatory complications: Secondary | ICD-10-CM | POA: Insufficient documentation

## 2012-09-24 DIAGNOSIS — R21 Rash and other nonspecific skin eruption: Secondary | ICD-10-CM | POA: Insufficient documentation

## 2012-09-24 DIAGNOSIS — IMO0002 Reserved for concepts with insufficient information to code with codable children: Secondary | ICD-10-CM | POA: Insufficient documentation

## 2012-09-24 DIAGNOSIS — I152 Hypertension secondary to endocrine disorders: Secondary | ICD-10-CM | POA: Insufficient documentation

## 2012-09-24 DIAGNOSIS — I10 Essential (primary) hypertension: Secondary | ICD-10-CM

## 2012-09-24 DIAGNOSIS — Z0000 Encounter for general adult medical examination without abnormal findings: Secondary | ICD-10-CM | POA: Insufficient documentation

## 2012-09-24 DIAGNOSIS — E669 Obesity, unspecified: Secondary | ICD-10-CM | POA: Insufficient documentation

## 2012-09-24 DIAGNOSIS — R87619 Unspecified abnormal cytological findings in specimens from cervix uteri: Secondary | ICD-10-CM | POA: Insufficient documentation

## 2012-09-24 DIAGNOSIS — Z141 Cystic fibrosis carrier: Secondary | ICD-10-CM | POA: Insufficient documentation

## 2012-09-24 LAB — POCT CBC
Granulocyte percent: 68.2 %G (ref 37–80)
HCT, POC: 38 % (ref 37.7–47.9)
Hemoglobin: 12.7 g/dL (ref 12.2–16.2)
Lymph, poc: 3 (ref 0.6–3.4)
MCH, POC: 24.7 pg — AB (ref 27–31.2)
MCHC: 33.4 g/dL (ref 31.8–35.4)
MCV: 73.9 fL — AB (ref 80–97)
MPV: 8.3 fL (ref 0–99.8)
POC Granulocyte: 7 — AB (ref 2–6.9)
POC LYMPH PERCENT: 29.1 %L (ref 10–50)
Platelet Count, POC: 318 10*3/uL (ref 142–424)
RBC: 5.1 M/uL (ref 4.04–5.48)
RDW, POC: 14.1 %
WBC: 10.2 10*3/uL (ref 4.6–10.2)

## 2012-09-24 MED ORDER — KETOCONAZOLE 2 % EX CREA: TOPICAL_CREAM | Freq: Two times a day (BID) | CUTANEOUS | Status: AC

## 2012-09-24 MED ORDER — HYDROCHLOROTHIAZIDE 25 MG PO TABS: 25.0000 mg | ORAL_TABLET | Freq: Every day | ORAL | Status: DC

## 2012-09-24 NOTE — Progress Notes (Signed)
Patient ID: [name], female   DOB: 11-20-1970, 42 y.o.   MRN: [id number] SUBJECTIVE: CC: Chief Complaint  Patient presents with  . Annual Exam    NO PAP  . Edema    worse as the day goes by    HPI: Had her GYN exam recently.  Breakfast: wrap, [place] sausage 2 eggs Lunch: grilled fish or chicken Supper: same  Patient is here for follow up of hypertension: denies Headache;deniesChest Pain;denies weakness;denies Shortness of Breath or Orthopnea;denies Visual changes;denies palpitations;denies cough;denies pedal edema;denies symptoms of TIA or stroke; Compliance with medications.ran out of HCTZ sevceral  Months ago denies Problems with medications.  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  hasn't taken care of her health with diet and exercise.   Past Medical History  Diagnosis Date  . Obesity   . Cystic fibrosis carrier   . H/O candidiasis   . Hx: UTI (urinary tract infection)     x 1  . H/O chlamydia infection 2006    treated x 1  . Abnormal Pap smear 2007    colpo  . Complication of anesthesia     Takes awhile for patient to awake.  . Increased BMI   . Hypertension   . Vulvitis 04/27/06  . FHx: diabetes mellitus   . FHx: hypertension   . FHx: scoliosis   . FHx: heart disease    Past Surgical History  Procedure Laterality Date  . Wisdom tooth extraction    . Cesarean section      x2   History   Social History  . Marital Status: Single    Spouse Name: N/A    Number of Children: N/A  . Years of Education: N/A   Occupational History  . Not on file.   Social History Main Topics  . Smoking status: Never Smoker   . Smokeless tobacco: Never Used  . Alcohol Use: No  . Drug Use: No  . Sexual Activity: Yes   Other Topics Concern  . Not on file   Social History Narrative  . No narrative on file   Family History  Problem Relation Age of Onset  . Heart disease Mother   . Hypertension Mother   . Diabetes Mother   . Hypertension Sister   . Alcohol  abuse Maternal Uncle   . Mental illness Paternal Grandmother     alzheimer's  . Diabetes Paternal Grandmother    No current outpatient prescriptions on file prior to visit.   No current facility-administered medications on file prior to visit.   Allergies  Allergen Reactions  . Percocet [Oxycodone-Acetaminophen]     There is no immunization history on file for this patient. Prior to Admission medications   Medication Sig Start Date End Date Taking? Authorizing Provider  hydrochlorothiazide (HYDRODIURIL) 25 MG tablet Take 25 mg by mouth daily.    Historical Provider, MD    ROS: As above in the HPI. All other systems are stable or negative.  OBJECTIVE: APPEARANCE:  Patient in no acute distress.The patient appeared well nourished and normally developed. Acyanotic. Waist: VITAL SIGNS:132/82 recheck BP 137/80  Pulse 82  Temp(Src) 97.7 F (36.5 C) (Oral)  Ht 5\' 5"  (1.651 m)  Wt 277 lb (125.646 kg)  BMI 46.1 kg/m2  AAF obese  SKIN: warm and  Dry without overt rashes, tattoos and scars  HEAD and Neck: without JVD, Head and scalp: normal Eyes:No scleral icterus. Fundi normal, eye movements normal. Ears: Auricle normal, canal normal, Tympanic membranes normal, insufflation  normal. Nose: normal Throat: normal Neck & thyroid: normal  CHEST & LUNGS: Chest wall: normal Lungs: Clear  CVS: Reveals the PMI to be normally located. Regular rhythm, First and Second Heart sounds are normal,  absence of murmurs, rubs or gallops. Peripheral vasculature: Radial pulses: normal Dorsal pedis pulses: normal Posterior pulses: normal  ABDOMEN:  Appearance: Obese Benign, no organomegaly, no masses, no Abdominal Aortic enlargement. No Guarding , no rebound. No Bruits. Bowel sounds: normal  RECTAL: N/A GU: N/A  EXTREMETIES: 1+ edematous of legs  MUSCULOSKELETAL:  Spine: normal Joints: intact  NEUROLOGIC: oriented to time,place and person; nonfocal. Strength is  normal Sensory is normal Reflexes are normal Cranial Nerves are normal.  ASSESSMENT: Annual physical exam - Plan: CMP14+EGFR, NMR, lipoprofile, Thyroid Panel With TSH, POCT CBC, Vitamin D 25 hydroxy  Pedal edema  HTN (hypertension) - Plan: hydrochlorothiazide (HYDRODIURIL) 25 MG tablet  Screening examination for infectious disease - Plan: HIV antibody  Rash and nonspecific skin eruption - Plan: ketoconazole (NIZORAL) 2 % cream  dependent edema, due to obesity , sitting  With legs dependent position.  PLAN:      Dr Woodroe Mode Recommendations  For nutrition information, I recommend books:  1).Eat to Live by Dr Monico Hoar. 2).Prevent and Reverse Heart Disease by Dr Suzzette Righter. 3) Dr Katherina Right Book:  Program to Reverse Diabetes  Exercise recommendations are:  If unable to walk, then the patient can exercise in a chair 3 times a day. By flapping arms like a bird gently and raising legs outwards to the front.  If ambulatory, the patient can go for walks for 30 minutes 3 times a week. Then increase the intensity and duration as tolerated.  Goal is to try to attain exercise frequency to 5 times a week.  If applicable: Best to perform resistance exercises (machines or weights) 2 days a week and cardio type exercises 3 days per week.        HEALTH MAINTENANCE Immunizations: Tetanus-Diphtheria Booster due:? Pertusis Booster due:? Flu Shot Due: every Fall Pneumonia Vaccine: usually at 42 years of age unless there are certain risk situations. Herpes Zoster/Shingles Vaccine due: usually at 42 years of age HPV JYN:WGNF age 88 to 18 years in males and females.  Healthy Life Habits: Exercise Goal: 5-6 days/week; start gradually(ie 30 minutes/3days per week) Nutrition: Balanced healthy meals including Vegetables and Fruits. Consider  Reading the following books: 1) Eat to Live by Dr Ottis Stain; 2) Prevent and Reverse Heart Disease by Dr Suzzette Righter.  Vitamins:daily regular multivitamin is okay Aspirin: Stop Tobacco Use:n/a Seat Belt Use:+++ recommended Sunscreen Use:+++ recommended Osteoporosis Prevention: 1) Exercise 2) Calcium/Vitamin D requirements:he [employer] of the [employer] recommends:    Calcium:  800 mg/day for children 64-72 years of age          42 mg/day for children 46-90 years of age          42 mg/day for adults 19-14 years of age          42 mg/day for everyone more than 42 years of age     Vitamin D: 800 IU per day or as prescribed if you are deficient.  Recommended Screening Tests: Colon Cancer Screening:at age 11 years Blood work: today Cholesterol Screening:    todayt      HIV:  today                  Hepatitis C(people born 1945-1965):n/a  Mammogram:  due DEXA/Bone Density:n/a GYN Exam:recent Monthly Self Breast Exam:++++   Eye Exam: every 1 to 2 years recommended Dental Health: at least every 6 months  Others:    Living Will/Healthcare Power of Attorney: should have this in order with your personal estate planning   Orders Placed This Encounter  Procedures  . CMP14+EGFR  . NMR, lipoprofile  . Thyroid Panel With TSH  . Vitamin D 25 hydroxy  . HIV antibody  . POCT CBC    Meds ordered this encounter  Medications  . DISCONTD: hydrochlorothiazide (HYDRODIURIL) 25 MG tablet    Sig: Take 25 mg by mouth daily.  . hydrochlorothiazide (HYDRODIURIL) 25 MG tablet    Sig: Take 1 tablet (25 mg total) by mouth daily.    Dispense:  100 tablet    Refill:  3  . ketoconazole (NIZORAL) 2 % cream    Sig: Apply topically 2 (two) times daily.    Dispense:  60 g    Refill:  1   Discussed above handouts given in the AVS..  Return in about 2 months (around 11/24/2012) for Recheck medical problems, recheck BP.  Bear Osten P. Modesto Charon, M.D.

## 2012-09-24 NOTE — Patient Instructions (Addendum)
      Dr Woodroe Mode Recommendations  For nutrition information, I recommend books:  1).Eat to Live by Dr Monico Hoar. 2).Prevent and Reverse Heart Disease by Dr Suzzette Righter. 3) Dr Katherina Right Book:  Program to Reverse Diabetes  Exercise recommendations are:  If unable to walk, then the patient can exercise in a chair 3 times a day. By flapping arms like a bird gently and raising legs outwards to the front.  If ambulatory, the patient can go for walks for 30 minutes 3 times a week. Then increase the intensity and duration as tolerated.  Goal is to try to attain exercise frequency to 5 times a week.  If applicable: Best to perform resistance exercises (machines or weights) 2 days a week and cardio type exercises 3 days per week.        HEALTH MAINTENANCE Immunizations: Tetanus-Diphtheria Booster due:? Pertusis Booster due:? Flu Shot Due: every Fall Pneumonia Vaccine: usually at 42 years of age unless there are certain risk situations. Herpes Zoster/Shingles Vaccine due: usually at 41 years of age HPV ZOX:WRUE age 17 to 17 years in males and females.  Healthy Life Habits: Exercise Goal: 5-6 days/week; start gradually(ie 30 minutes/3days per week) Nutrition: Balanced healthy meals including Vegetables and Fruits. Consider  Reading the following books: 1) Eat to Live by Dr Ottis Stain; 2) Prevent and Reverse Heart Disease by Dr Suzzette Righter.  Vitamins:daily regular multivitamin is okay Aspirin: Stop Tobacco Use:n/a Seat Belt Use:+++ recommended Sunscreen Use:+++ recommended Osteoporosis Prevention: 1) Exercise 2) Calcium/Vitamin D requirements:he [employer] [facility] [employer] recommends:    Calcium:  800 mg/day for children 106-54 years of age          42 mg/day for children 68-26 years of age          42 mg/day for adults 65-61 years of age          42 mg/day for everyone more than 42 years of age     Vitamin D: 800 IU per  day or as prescribed if you are deficient.  Recommended Screening Tests: Colon Cancer Screening:at age 24 years Blood work: today Cholesterol Screening:    todayt      HIV:  today                  Hepatitis C(people born 1945-1965):n/a  Mammogram: due DEXA/Bone Density:n/a GYN Exam:recent Monthly Self Breast Exam:++++   Eye Exam: every 1 to 2 years recommended Dental Health: at least every 6 months  Others:    Living Will/Healthcare Power of Attorney: should have this in order with your personal estate planning

## 2012-09-26 LAB — VITAMIN D 25 HYDROXY (VIT D DEFICIENCY, FRACTURES): Vit D, 25-Hydroxy: 26 ng/mL — ABNORMAL LOW (ref 30.0–100.0)

## 2012-09-26 LAB — CMP14+EGFR
ALT: 42 IU/L — ABNORMAL HIGH (ref 0–32)
AST: 28 IU/L (ref 0–40)
Albumin/Globulin Ratio: 1.5 (ref 1.1–2.5)
Albumin: 4.3 g/dL (ref 3.5–5.5)
Alkaline Phosphatase: 70 IU/L (ref 39–117)
BUN/Creatinine Ratio: 13 (ref 9–23)
BUN: 11 mg/dL (ref 6–24)
CO2: 25 mmol/L (ref 18–29)
Calcium: 9.6 mg/dL (ref 8.7–10.2)
Chloride: 101 mmol/L (ref 97–108)
Creatinine, Ser: 0.83 mg/dL (ref 0.57–1.00)
GFR calc Af Amer: 101 mL/min/{1.73_m2} (ref >59)
GFR calc non Af Amer: 87 mL/min/{1.73_m2} (ref >59)
Globulin, Total: 2.8 g/dL (ref 1.5–4.5)
Glucose: 87 mg/dL (ref 65–99)
Potassium: 4.2 mmol/L (ref 3.5–5.2)
Sodium: 140 mmol/L (ref 134–144)
Total Bilirubin: 0.5 mg/dL (ref 0.0–1.2)
Total Protein: 7.1 g/dL (ref 6.0–8.5)

## 2012-09-26 LAB — NMR, LIPOPROFILE
Cholesterol: 191 mg/dL (ref <200)
HDL Cholesterol by NMR: 41 mg/dL (ref >=40)
HDL Particle Number: 32 umol/L (ref >=30.5)
LDL Particle Number: 1974 nmol/L — ABNORMAL HIGH (ref <1000)
LDL Size: 19.9 nm — ABNORMAL LOW (ref >20.5)
LDLC SERPL CALC-MCNC: 120 mg/dL — ABNORMAL HIGH (ref <100)
LP-IR Score: 75 — ABNORMAL HIGH (ref <=45)
Small LDL Particle Number: 1312 nmol/L — ABNORMAL HIGH (ref <=527)
Triglycerides by NMR: 151 mg/dL — ABNORMAL HIGH (ref <150)

## 2012-09-26 LAB — THYROID PANEL WITH TSH
Free Thyroxine Index: 2.1 (ref 1.2–4.9)
T3 Uptake Ratio: 23 % — ABNORMAL LOW (ref 24–39)
T4, Total: 9.1 ug/dL (ref 4.5–12.0)
TSH: 2.98 u[IU]/mL (ref 0.450–4.500)

## 2012-09-26 LAB — HIV ANTIBODY (ROUTINE TESTING W REFLEX)
HIV 1/O/2 Abs-Index Value: <1 (ref <1.00)
HIV-1/HIV-2 Ab: NONREACTIVE

## 2012-09-26 NOTE — Progress Notes (Signed)
Quick Note:  Labs abnormal. 1)Vit D is low: start on Vit D 2000 units daily. 2) lipids are abnormal.she will need to aggressively change her diet and lose weight.go for a plant based diet, otherwise I will need to recommend that she starts on a statin. And she has one of the liver enzymes mildly elevated. i suspect from fatty liver. We will need to investigate further in 2 months if the results are unchanged with the liver enzymes. Keep the 2 months follow up. Read the book : Eat to Live.  ______

## 2012-10-03 ENCOUNTER — Other Ambulatory Visit: Payer: Self-pay

## 2012-10-03 DIAGNOSIS — Z1231 Encounter for screening mammogram for malignant neoplasm of breast: Secondary | ICD-10-CM

## 2012-10-26 ENCOUNTER — Ambulatory Visit
Admission: RE | Admit: 2012-10-26 | Discharge: 2012-10-26 | Disposition: A | Discharge status: Home / Self Care | Payer: 59 | Source: Ambulatory Visit

## 2012-10-26 DIAGNOSIS — Z1231 Encounter for screening mammogram for malignant neoplasm of breast: Secondary | ICD-10-CM

## 2012-11-05 ENCOUNTER — Encounter: Payer: Self-pay | Admitting: *Deleted

## 2012-11-12 ENCOUNTER — Telehealth: Payer: Self-pay | Admitting: Family Medicine

## 2012-11-12 NOTE — Telephone Encounter (Signed)
Pt.notified

## 2012-11-30 ENCOUNTER — Ambulatory Visit: Payer: 59 | Admitting: Family Medicine

## 2012-12-03 ENCOUNTER — Ambulatory Visit (INDEPENDENT_AMBULATORY_CARE_PROVIDER_SITE_OTHER): Payer: 59 | Admitting: Family Medicine

## 2012-12-03 ENCOUNTER — Encounter: Payer: Self-pay | Admitting: Family Medicine

## 2012-12-03 VITALS — BP 133/83 | HR 85 | Temp 98.7°F | Ht 64.0 in | Wt 273.2 lb

## 2012-12-03 DIAGNOSIS — E669 Obesity, unspecified: Secondary | ICD-10-CM

## 2012-12-03 DIAGNOSIS — Z141 Cystic fibrosis carrier: Secondary | ICD-10-CM

## 2012-12-03 DIAGNOSIS — R609 Edema, unspecified: Secondary | ICD-10-CM

## 2012-12-03 DIAGNOSIS — R600 Localized edema: Secondary | ICD-10-CM

## 2012-12-03 DIAGNOSIS — I10 Essential (primary) hypertension: Secondary | ICD-10-CM

## 2012-12-03 DIAGNOSIS — R21 Rash and other nonspecific skin eruption: Secondary | ICD-10-CM

## 2012-12-03 DIAGNOSIS — Z23 Encounter for immunization: Secondary | ICD-10-CM

## 2012-12-03 DIAGNOSIS — E559 Vitamin D deficiency, unspecified: Secondary | ICD-10-CM

## 2012-12-03 DIAGNOSIS — R7402 Elevation of levels of lactic acid dehydrogenase (LDH): Secondary | ICD-10-CM

## 2012-12-03 DIAGNOSIS — R748 Abnormal levels of other serum enzymes: Secondary | ICD-10-CM | POA: Insufficient documentation

## 2012-12-03 MED ORDER — TRIAMCINOLONE ACETONIDE 0.05 % EX OINT: 1.0000 "application " | TOPICAL_OINTMENT | Freq: Two times a day (BID) | CUTANEOUS | Status: DC

## 2012-12-03 NOTE — Patient Instructions (Signed)
      Dr Tywone Bembenek's Recommendations  For nutrition information, I recommend books:  1).Eat to Live by Dr Joel Fuhrman. 2).Prevent and Reverse Heart Disease by Dr Caldwell Esselstyn. 3) Dr Neal Barnard's Book:  Program to Reverse Diabetes  Exercise recommendations are:  If unable to walk, then the patient can exercise in a chair 3 times a day. By flapping arms like a bird gently and raising legs outwards to the front.  If ambulatory, the patient can go for walks for 30 minutes 3 times a week. Then increase the intensity and duration as tolerated.  Goal is to try to attain exercise frequency to 5 times a week.  If applicable: Best to perform resistance exercises (machines or weights) 2 days a week and cardio type exercises 3 days per week.  

## 2012-12-03 NOTE — Progress Notes (Signed)
Patient ID: [name], female   DOB: 30-Dec-1970, 42 y.o.   MRN: [id number] SUBJECTIVE: CC: Chief Complaint  Patient presents with  . Follow-up    2 month folllow up     HPI:  Came for follow up 1) obesity ; making some dietary changes. Reading the books recommended. Started on Eat to Live. slow reader.  2) abnormal labs: 1 transaminase  Elevation. No h/o hepatitis.  3)Patient is here for follow up of hypertension: denies Headache;deniesChest Pain;denies weakness;denies Shortness of Breath or Orthopnea;denies Visual changes;denies palpitations;denies cough;denies pedal edema;denies symptoms of TIA or stroke; admits to Compliance with medications. denies Problems with medications.   Past Medical History  Diagnosis Date  . Obesity   . Cystic fibrosis carrier   . H/O candidiasis   . Hx: UTI (urinary tract infection)     x 1  . H/O chlamydia infection 2006    treated x 1  . Abnormal Pap smear 2007    colpo  . Complication of anesthesia     Takes awhile for patient to awake.  . Increased BMI   . Vulvitis 04/27/06  . FHx: hypertension   . FHx: scoliosis   . FHx: heart disease   . Hypertension    Past Surgical History  Procedure Laterality Date  . Wisdom tooth extraction    . Cesarean section      x2   History   Social History  . Marital Status: Single    Spouse Name: N/A    Number of Children: N/A  . Years of Education: N/A   Occupational History  . Not on file.   Social History Main Topics  . Smoking status: Never Smoker   . Smokeless tobacco: Never Used  . Alcohol Use: No  . Drug Use: No  . Sexual Activity: Yes   Other Topics Concern  . Not on file   Social History Narrative  . No narrative on file   Family History  Problem Relation Age of Onset  . Heart disease Mother   . Hypertension Mother   . Diabetes Mother   . Hypertension Sister   . Alcohol abuse Maternal Uncle   . Mental illness Paternal Grandmother     alzheimer's  . Diabetes  Paternal Grandmother    Current Outpatient Prescriptions on File Prior to Visit  Medication Sig Dispense Refill  . hydrochlorothiazide (HYDRODIURIL) 25 MG tablet Take 1 tablet (25 mg total) by mouth daily.  100 tablet  3   No current facility-administered medications on file prior to visit.   Allergies  Allergen Reactions  . Percocet [Oxycodone-Acetaminophen]    Immunization History  Administered Date(s) Administered  . Influenza,inj,Quad PF,36+ Mos 12/03/2012   Prior to Admission medications   Medication Sig Start Date End Date Taking? Authorizing Provider  hydrochlorothiazide (HYDRODIURIL) 25 MG tablet Take 1 tablet (25 mg total) by mouth daily. 09/24/12  Yes Ileana Ladd, MD  TRIAMCINOLONE ACETONIDE, TOP, 0.05 % OINT Apply 1 application topically 2 (two) times daily. For 10 days 12/03/12   Ileana Ladd, MD     ROS: As above in the HPI. All other systems are stable or negative.  OBJECTIVE: APPEARANCE:  Patient in no acute distress.The patient appeared well nourished and normally developed. Acyanotic. Waist: VITAL SIGNS:BP 133/83  Pulse 85  Temp(Src) 98.7 F (37.1 C) (Oral)  Ht 5\' 4"  (1.626 m)  Wt 273 lb 3.2 oz (123.923 kg)  BMI 46.87 kg/m2 Obese AAF. Weight 4 lb weight loss.  SKIN: warm and  Dry Rash: scaly rash eczematous RLQ of abdominal wall. No raised edges.  HEAD and Neck: without JVD, Head and scalp: normal Eyes:No scleral icterus. Fundi normal, eye movements normal. Ears: Auricle normal, canal normal, Tympanic membranes normal, insufflation normal. Nose: normal Throat: normal Neck & thyroid: normal  CHEST & LUNGS: Chest wall: normal Lungs: Clear  CVS: Reveals the PMI to be normally located. Regular rhythm, First and Second Heart sounds are normal,  absence of murmurs, rubs or gallops. Peripheral vasculature: Radial pulses: normal Dorsal pedis pulses: normal Posterior pulses: normal  ABDOMEN:  Appearance: obese Benign, no organomegaly, no  masses, no Abdominal Aortic enlargement. No Guarding , no rebound. No Bruits. Bowel sounds: normal  RECTAL: N/A GU: N/A  EXTREMETIES: nonedematous.  MUSCULOSKELETAL:  Spine: normal Joints: intact  NEUROLOGIC: oriented to time,place and person; nonfocal. Strength is normal Sensory is normal Reflexes are normal Cranial Nerves are normal.  ASSESSMENT: Hypertension  Pedal edema  Obesity  Need for prophylactic vaccination and inoculation against influenza  Cystic fibrosis carrier  Rash and nonspecific skin eruption - Plan: TRIAMCINOLONE ACETONIDE, TOP, 0.05 % OINT  Unspecified vitamin D deficiency - Plan: Vit D  25 hydroxy (rtn osteoporosis monitoring)  Abnormal transaminases - Plan: Hepatic function panel, Hepatitis panel, acute  BP control could be  Better. Needs weight loss. Discussed elevated liver enzyme.  PLAN:  Orders Placed This Encounter  Procedures  . Hepatic function panel  . Hepatitis panel, acute  . Vit D  25 hydroxy (rtn osteoporosis monitoring)   Meds ordered this encounter  Medications  . TRIAMCINOLONE ACETONIDE, TOP, 0.05 % OINT    Sig: Apply 1 application topically 2 (two) times daily. For 10 days    Dispense:  45 g    Refill:  0        Dr Woodroe Mode Recommendations  For nutrition information, I recommend books:  1).Eat to Live by Dr Monico Hoar. 2).Prevent and Reverse Heart Disease by Dr Suzzette Righter. 3) Dr Katherina Right Book:  Program to Reverse Diabetes  Exercise recommendations are:  If unable to walk, then the patient can exercise in a chair 3 times a day. By flapping arms like a bird gently and raising legs outwards to the front.  If ambulatory, the patient can go for walks for 30 minutes 3 times a week. Then increase the intensity and duration as tolerated.  Goal is to try to attain exercise frequency to 5 times a week.  If applicable: Best to perform resistance exercises (machines or weights) 2 days a week and  cardio type exercises 3 days per week.  There are no discontinued medications. Return in about 3 months (around 03/05/2013) for Recheck medical problems.  Herman Fiero P. Modesto Charon, M.D.

## 2012-12-04 LAB — HEPATIC FUNCTION PANEL
ALT: 23 IU/L (ref 0–32)
AST: 17 IU/L (ref 0–40)
Albumin: 4.5 g/dL (ref 3.5–5.5)
Alkaline Phosphatase: 80 IU/L (ref 39–117)
Bilirubin, Direct: 0.13 mg/dL (ref 0.00–0.40)
Total Bilirubin: 0.4 mg/dL (ref 0.0–1.2)
Total Protein: 7.7 g/dL (ref 6.0–8.5)

## 2012-12-04 LAB — HEPATITIS PANEL, ACUTE
Hep A IgM: NEGATIVE
Hep B C IgM: NEGATIVE
Hep C Virus Ab: <0.1 s/co ratio (ref 0.0–0.9)
Hepatitis B Surface Ag: NEGATIVE

## 2012-12-04 LAB — VITAMIN D 25 HYDROXY (VIT D DEFICIENCY, FRACTURES): Vit D, 25-Hydroxy: 33.8 ng/mL (ref 30.0–100.0)

## 2012-12-05 NOTE — Progress Notes (Signed)
Quick Note:  Call patient. Labs normal. No change in plan. ______ 

## 2013-03-07 ENCOUNTER — Encounter (INDEPENDENT_AMBULATORY_CARE_PROVIDER_SITE_OTHER): Payer: Self-pay

## 2013-03-07 ENCOUNTER — Encounter: Payer: Self-pay | Admitting: Family Medicine

## 2013-03-07 ENCOUNTER — Ambulatory Visit (INDEPENDENT_AMBULATORY_CARE_PROVIDER_SITE_OTHER): Payer: 59 | Admitting: Family Medicine

## 2013-03-07 VITALS — BP 137/91 | HR 92 | Temp 97.7°F | Ht 64.0 in | Wt 278.2 lb

## 2013-03-07 DIAGNOSIS — R609 Edema, unspecified: Secondary | ICD-10-CM

## 2013-03-07 DIAGNOSIS — I10 Essential (primary) hypertension: Secondary | ICD-10-CM

## 2013-03-07 DIAGNOSIS — R748 Abnormal levels of other serum enzymes: Secondary | ICD-10-CM

## 2013-03-07 DIAGNOSIS — R635 Abnormal weight gain: Secondary | ICD-10-CM

## 2013-03-07 DIAGNOSIS — R740 Nonspecific elevation of levels of transaminase and lactic acid dehydrogenase [LDH]: Secondary | ICD-10-CM

## 2013-03-07 DIAGNOSIS — Z141 Cystic fibrosis carrier: Secondary | ICD-10-CM

## 2013-03-07 DIAGNOSIS — Z8619 Personal history of other infectious and parasitic diseases: Secondary | ICD-10-CM

## 2013-03-07 DIAGNOSIS — Z8249 Family history of ischemic heart disease and other diseases of the circulatory system: Secondary | ICD-10-CM

## 2013-03-07 DIAGNOSIS — R600 Localized edema: Secondary | ICD-10-CM

## 2013-03-07 DIAGNOSIS — R7402 Elevation of levels of lactic acid dehydrogenase (LDH): Secondary | ICD-10-CM

## 2013-03-07 DIAGNOSIS — E669 Obesity, unspecified: Secondary | ICD-10-CM

## 2013-03-07 DIAGNOSIS — E785 Hyperlipidemia, unspecified: Secondary | ICD-10-CM | POA: Insufficient documentation

## 2013-03-07 DIAGNOSIS — E559 Vitamin D deficiency, unspecified: Secondary | ICD-10-CM

## 2013-03-07 NOTE — Patient Instructions (Addendum)
Dr Woodroe Mode Recommendations  For nutrition information, I recommend books:  1).Eat to Live by Dr Monico Hoar. 2).Prevent and Reverse Heart Disease by Dr Suzzette Righter. 3) Dr Katherina Right Book:  Program to Reverse Diabetes  Exercise recommendations are:  If unable to walk, then the patient can exercise in a chair 3 times a day. By flapping arms like a bird gently and raising legs outwards to the front.  If ambulatory, the patient can go for walks for 30 minutes 3 times a week. Then increase the intensity and duration as tolerated.  Goal is to try to attain exercise frequency to 5 times a week.  If applicable: Best to perform resistance exercises (machines or weights) 2 days a week and cardio type exercises 3 days per week.   Obesity Obesity is defined as having too much total body fat and a body mass index (BMI) of 30 or more. BMI is an estimate of body fat and is calculated from your height and weight. Obesity happens when you consume more calories than you can burn by exercising or performing daily physical tasks. Prolonged obesity can cause major illnesses or emergencies, such as:   A stroke.  Heart disease.  Diabetes.  Cancer.  Arthritis.  High blood pressure (hypertension).  High cholesterol.  Sleep apnea.  Erectile dysfunction.  Infertility problems. CAUSES   Regularly eating unhealthy foods.  Physical inactivity.  Certain disorders, such as an underactive thyroid (hypothyroidism), Cushing's syndrome, and polycystic ovarian syndrome.  Certain medicines, such as steroids, some depression medicines, and antipsychotics.  Genetics.  Lack of sleep. DIAGNOSIS  A caregiver can diagnose obesity after calculating your BMI. Obesity will be diagnosed if your BMI is 30 or higher.  There are other methods of measuring obesity levels. Some other methods include measuring your skin fold thickness, your waist circumference, and comparing your hip  circumference to your waist circumference. TREATMENT  A healthy treatment program includes some or all of the following:  Long-term dietary changes.  Exercise and physical activity.  Behavioral and lifestyle changes.  Medicine only under the supervision of your caregiver. Medicines may help, but only if they are used with diet and exercise programs. An unhealthy treatment program includes:  Fasting.  Fad diets.  Supplements and drugs. These choices do not succeed in long-term weight control.  HOME CARE INSTRUCTIONS   Exercise and perform physical activity as directed by your caregiver. To increase physical activity, try the following:  Use stairs instead of elevators.  Park farther away from store entrances.  Garden, bike, or walk instead of watching television or using the computer.  Eat healthy, low-calorie foods and drinks on a regular basis. Eat more fruits and vegetables. Use low-calorie cookbooks or take healthy cooking classes.  Limit fast food, sweets, and processed snack foods.  Eat smaller portions.  Keep a daily journal of everything you eat. There are many free websites to help you with this. It may be helpful to measure your foods so you can determine if you are eating the correct portion sizes.  Avoid drinking alcohol. Drink more water and drinks without calories.  Take vitamins and supplements only as recommended by your caregiver.  Weight-loss support groups, Optometrist, counselors, and stress reduction education can also be very helpful. SEEK IMMEDIATE MEDICAL CARE IF:  You have chest pain or tightness.  You have trouble breathing or feel short of breath.  You have weakness or leg numbness.  You feel confused or have  trouble talking.  You have sudden changes in your vision. MAKE SURE YOU:  Understand these instructions.  Will watch your condition.  Will get help right away if you are not doing well or get worse. Document  Released: 03/03/2004 Document Revised: 07/26/2011 Document Reviewed: 03/02/2011 [facility] Patient Information 2014 [employer], [place].

## 2013-03-07 NOTE — Progress Notes (Signed)
Patient ID: [name], female   DOB: Jun 11, 1970, 43 y.o.   MRN: [id number] SUBJECTIVE: CC: Chief Complaint  Patient presents with  . Follow-up    3 month follow no complaints     HPI: Patient is here for follow up of hyperlipidemia/HTN/Obesity: denies Headache;denies Chest Pain;denies weakness;denies Shortness of Breath and orthopnea;denies Visual changes;denies palpitations;denies cough;denies pedal edema;denies symptoms of TIA or stroke;deniesClaudication symptoms. admits to Compliance with medications; denies Problems with medications.  Exercise: not good; has X-Box exercise programs.  Diet could be better    Past Medical History  Diagnosis Date  . Obesity   . Cystic fibrosis carrier   . H/O candidiasis   . Hx: UTI (urinary tract infection)     x 1  . H/O chlamydia infection 2006    treated x 1  . Abnormal Pap smear 2007    colpo  . Complication of anesthesia     Takes awhile for patient to awake.  . Increased BMI   . Vulvitis 04/27/06  . FHx: hypertension   . FHx: scoliosis   . FHx: heart disease   . Hypertension   . Hyperlipidemia    Past Surgical History  Procedure Laterality Date  . Wisdom tooth extraction    . Cesarean section      x2   History   Social History  . Marital Status: Single    Spouse Name: N/A    Number of Children: N/A  . Years of Education: N/A   Occupational History  . Not on file.   Social History Main Topics  . Smoking status: Never Smoker   . Smokeless tobacco: Never Used  . Alcohol Use: No  . Drug Use: No  . Sexual Activity: Yes   Other Topics Concern  . Not on file   Social History Narrative  . No narrative on file   Family History  Problem Relation Age of Onset  . Heart disease Mother   . Hypertension Mother   . Diabetes Mother   . Hypertension Sister   . Alcohol abuse Maternal Uncle   . Mental illness Paternal Grandmother     alzheimer's  . Diabetes Paternal Grandmother    Current Outpatient  Prescriptions on File Prior to Visit  Medication Sig Dispense Refill  . hydrochlorothiazide (HYDRODIURIL) 25 MG tablet Take 1 tablet (25 mg total) by mouth daily.  100 tablet  3  . TRIAMCINOLONE ACETONIDE, TOP, 0.05 % OINT Apply 1 application topically 2 (two) times daily. For 10 days  45 g  0   No current facility-administered medications on file prior to visit.   Allergies  Allergen Reactions  . Percocet [Oxycodone-Acetaminophen]    Immunization History  Administered Date(s) Administered  . Influenza,inj,Quad PF,36+ Mos 12/03/2012   Prior to Admission medications   Medication Sig Start Date End Date Taking? Authorizing Provider  hydrochlorothiazide (HYDRODIURIL) 25 MG tablet Take 1 tablet (25 mg total) by mouth daily. 09/24/12  Yes Vernie Shanks, MD  TRIAMCINOLONE ACETONIDE, TOP, 0.05 % OINT Apply 1 application topically 2 (two) times daily. For 10 days 12/03/12  Yes Vernie Shanks, MD     ROS: As above in the HPI. All other systems are stable or negative.  OBJECTIVE: APPEARANCE:  Patient in no acute distress.The patient appeared well nourished and normally developed. Acyanotic. Waist: VITAL SIGNS:BP 137/91  Pulse 92  Temp(Src) 97.7 F (36.5 C) (Oral)  Ht _0  (1.626 m)  Wt 278 lb 3.2 oz (126.191 kg)  BMI  47.73 kg/m2 Obese AAF  SKIN: warm and  Dry without overt rashes, tattoos and scars  HEAD and Neck: without JVD, Head and scalp: normal Eyes:No scleral icterus. Fundi normal, eye movements normal. Ears: Auricle normal, canal normal, Tympanic membranes normal, insufflation normal. Nose: normal Throat: normal Neck & thyroid: normal  CHEST & LUNGS: Chest wall: normal Lungs: Clear  CVS: Reveals the PMI to be normally located. Regular rhythm, First and Second Heart sounds are normal,  absence of murmurs, rubs or gallops. Peripheral vasculature: Radial pulses: normal Dorsal pedis pulses: normal Posterior pulses: normal  ABDOMEN:  Appearance: Obese Benign,  no organomegaly, no masses, no Abdominal Aortic enlargement. No Guarding , no rebound. No Bruits. Bowel sounds: normal  RECTAL: N/A GU: N/A  EXTREMETIES: nonedematous.  MUSCULOSKELETAL:  Spine: normal Joints: intact  NEUROLOGIC: oriented to time,place and person; nonfocal. Strength is normal Sensory is normal Reflexes are normal Cranial Nerves are normal. Results for orders placed in visit on 12/03/12  HEPATIC FUNCTION PANEL      Result Value Range   Total Protein 7.7  6.0 - 8.5 g/dL   Albumin 4.5  3.5 - 5.5 g/dL   Total Bilirubin 0.4  0.0 - 1.2 mg/dL   Bilirubin, Direct 0.13  0.00 - 0.40 mg/dL   Alkaline Phosphatase 80  39 - 117 IU/L   AST 17  0 - 40 IU/L   ALT 23  0 - 32 IU/L  HEPATITIS PANEL, ACUTE      Result Value Range   Hep A IgM Negative  Negative   Hepatitis B Surface Ag Negative  Negative   Hep B C IgM Negative  Negative   Hep C Virus Ab <0.1  0.0 - 0.9 s/co ratio  VITAMIN D 25 HYDROXY      Result Value Range   Vit D, 25-Hydroxy 33.8  30.0 - 100.0 ng/mL    ASSESSMENT: Obesity  Hypertension - Plan: CMP14+EGFR  Hyperlipidemia - Plan: CMP14+EGFR, NMR, lipoprofile  FHx: heart disease  Cystic fibrosis carrier  Abnormal transaminases - Plan: CMP14+EGFR  Unspecified vitamin D deficiency  Pedal edema  H/O chlamydia infection  PLAN:      Dr Paula Libra Recommendations  For nutrition information, I recommend books:  1).Eat to Live by Dr Excell Seltzer. 2).Prevent and Reverse Heart Disease by Dr Karl Luke. 3) Dr Janene Harvey Book:  Program to Reverse Diabetes  Exercise recommendations are:  If unable to walk, then the patient can exercise in a chair 3 times a day. By flapping arms like a bird gently and raising legs outwards to the front.  If ambulatory, the patient can go for walks for 30 minutes 3 times a week. Then increase the intensity and duration as tolerated.  Goal is to try to attain exercise frequency to 5 times a  week.  If applicable: Best to perform resistance exercises (machines or weights) 2 days a week and cardio type exercises 3 days per week.   1/2 counseling on LTC and diet and exercise changes and motivational changes needed and self recording of p[rogress and a progress diary.  Orders Placed This Encounter  Procedures  . CMP14+EGFR  . NMR, lipoprofile   No orders of the defined types were placed in this encounter.   There are no discontinued medications. Return in about 3 months (around 06/05/2013) for Recheck medical problems.  Maylen Waltermire P. Jacelyn Grip, M.D.

## 2013-03-09 LAB — CMP14+EGFR
ALT: 27 IU/L (ref 0–32)
AST: 19 IU/L (ref 0–40)
Albumin/Globulin Ratio: 1.5 (ref 1.1–2.5)
Albumin: 4.3 g/dL (ref 3.5–5.5)
Alkaline Phosphatase: 74 IU/L (ref 39–117)
BUN/Creatinine Ratio: 12 (ref 9–23)
BUN: 11 mg/dL (ref 6–24)
CO2: 22 mmol/L (ref 18–29)
Calcium: 9.2 mg/dL (ref 8.7–10.2)
Chloride: 101 mmol/L (ref 97–108)
Creatinine, Ser: 0.93 mg/dL (ref 0.57–1.00)
GFR calc Af Amer: 88 mL/min/{1.73_m2} (ref >59)
GFR calc non Af Amer: 76 mL/min/{1.73_m2} (ref >59)
Globulin, Total: 2.9 g/dL (ref 1.5–4.5)
Glucose: 109 mg/dL — ABNORMAL HIGH (ref 65–99)
Potassium: 4.1 mmol/L (ref 3.5–5.2)
Sodium: 139 mmol/L (ref 134–144)
Total Bilirubin: 0.4 mg/dL (ref 0.0–1.2)
Total Protein: 7.2 g/dL (ref 6.0–8.5)

## 2013-03-09 LAB — NMR, LIPOPROFILE
Cholesterol: 193 mg/dL (ref <200)
HDL Cholesterol by NMR: 42 mg/dL (ref >=40)
HDL Particle Number: 28.6 umol/L — ABNORMAL LOW (ref >=30.5)
LDL Particle Number: 1664 nmol/L — ABNORMAL HIGH (ref <1000)
LDL Size: 20 nm — ABNORMAL LOW (ref >20.5)
LDLC SERPL CALC-MCNC: 124 mg/dL — ABNORMAL HIGH (ref <100)
LP-IR Score: 73 — ABNORMAL HIGH (ref <=45)
Small LDL Particle Number: 1064 nmol/L — ABNORMAL HIGH (ref <=527)
Triglycerides by NMR: 133 mg/dL (ref <150)

## 2013-06-04 ENCOUNTER — Other Ambulatory Visit: Payer: Self-pay

## 2013-06-04 DIAGNOSIS — I10 Essential (primary) hypertension: Secondary | ICD-10-CM

## 2013-06-04 MED ORDER — HYDROCHLOROTHIAZIDE 25 MG PO TABS: 25.0000 mg | ORAL_TABLET | Freq: Every day | ORAL | Status: DC

## 2013-06-07 ENCOUNTER — Ambulatory Visit: Payer: 59 | Admitting: Family Medicine

## 2013-06-20 ENCOUNTER — Ambulatory Visit: Payer: 59 | Admitting: Physician Assistant

## 2013-08-05 ENCOUNTER — Other Ambulatory Visit: Payer: Self-pay | Admitting: *Deleted

## 2013-08-05 MED ORDER — HYDROCHLOROTHIAZIDE 25 MG PO TABS: 25.0000 mg | ORAL_TABLET | Freq: Every day | ORAL | Status: DC

## 2013-08-05 NOTE — Telephone Encounter (Signed)
Patient of Dr Modesto Charonwong. Was due to return for follow up at the end of April. Please advise on 90 day supply.

## 2013-10-17 ENCOUNTER — Other Ambulatory Visit: Payer: Self-pay | Admitting: Nurse Practitioner

## 2013-10-22 ENCOUNTER — Other Ambulatory Visit: Payer: Self-pay

## 2013-10-22 DIAGNOSIS — Z1231 Encounter for screening mammogram for malignant neoplasm of breast: Secondary | ICD-10-CM

## 2013-11-06 ENCOUNTER — Ambulatory Visit
Admission: RE | Admit: 2013-11-06 | Discharge: 2013-11-06 | Disposition: A | Discharge status: Home / Self Care | Payer: 59 | Source: Ambulatory Visit

## 2013-11-06 DIAGNOSIS — Z1231 Encounter for screening mammogram for malignant neoplasm of breast: Secondary | ICD-10-CM

## 2013-11-07 ENCOUNTER — Ambulatory Visit (INDEPENDENT_AMBULATORY_CARE_PROVIDER_SITE_OTHER): Payer: 59 | Admitting: Family Medicine

## 2013-11-07 ENCOUNTER — Encounter: Payer: Self-pay | Admitting: Family Medicine

## 2013-11-07 VITALS — BP 118/75 | HR 78 | Temp 98.4°F | Ht 64.0 in | Wt 281.0 lb

## 2013-11-07 DIAGNOSIS — E785 Hyperlipidemia, unspecified: Secondary | ICD-10-CM

## 2013-11-07 DIAGNOSIS — Z23 Encounter for immunization: Secondary | ICD-10-CM

## 2013-11-07 DIAGNOSIS — I10 Essential (primary) hypertension: Secondary | ICD-10-CM

## 2013-11-07 MED ORDER — CHLORTHALIDONE 25 MG PO TABS
25.0000 mg | ORAL_TABLET | Freq: Every day | ORAL | Status: DC
Start: 2013-11-07 — End: 2014-08-25

## 2013-11-07 NOTE — Patient Instructions (Signed)

## 2013-11-07 NOTE — Progress Notes (Signed)
   Subjective:    Patient ID: [name], female    DOB: [birth year], 43 y.o.   MRN: [id number]  HPI  43 year old female here to followup blood pressure and obesity. No current complaints except for some intermittent dependent edema. She continues to struggle with her weight. She has been on multiple diets and has lost weight only to gain it back. She is at risk for diabetes and hyperlipidemia based on her weight and family history.    Review of Systems  Cardiovascular: Positive for leg swelling.  All other systems reviewed and are negative.      Objective:   Physical Exam  Constitutional:  Pt obese    BP 118/75  Pulse 78  Temp(Src) 98.4 F (36.9 C) (Oral)  Ht _0  (1.626 m)  Wt 281 lb (127.461 kg)  BMI 48.21 kg/m2      Assessment & Plan:  1. Essential hypertension Will change to chlorthalidone when current Rx expires - BMP8+EGFR  2. Hyperlipidemia Lipids are not really high enough to treat  Wardell Honour MD

## 2013-11-08 LAB — BMP8+EGFR
BUN/Creatinine Ratio: 13 (ref 9–23)
BUN: 13 mg/dL (ref 6–24)
CO2: 26 mmol/L (ref 18–29)
CREATININE: 0.97 mg/dL (ref 0.57–1.00)
Calcium: 9.6 mg/dL (ref 8.7–10.2)
Chloride: 99 mmol/L (ref 97–108)
GFR, EST AFRICAN AMERICAN: 83 mL/min/{1.73_m2} (ref >59)
GFR, EST NON AFRICAN AMERICAN: 72 mL/min/{1.73_m2} (ref >59)
GLUCOSE: 120 mg/dL — AB (ref 65–99)
Potassium: 4.6 mmol/L (ref 3.5–5.2)
Sodium: 141 mmol/L (ref 134–144)

## 2013-12-09 ENCOUNTER — Encounter: Payer: Self-pay | Admitting: Family Medicine

## 2014-02-24 ENCOUNTER — Encounter: Payer: Self-pay | Admitting: *Deleted

## 2014-04-07 ENCOUNTER — Ambulatory Visit: Payer: Self-pay | Admitting: Family Medicine

## 2014-04-09 ENCOUNTER — Ambulatory Visit: Payer: Self-pay | Admitting: Family Medicine

## 2014-08-25 ENCOUNTER — Encounter: Payer: Self-pay | Admitting: Family Medicine

## 2014-08-25 ENCOUNTER — Encounter (INDEPENDENT_AMBULATORY_CARE_PROVIDER_SITE_OTHER): Payer: Self-pay

## 2014-08-25 ENCOUNTER — Ambulatory Visit (INDEPENDENT_AMBULATORY_CARE_PROVIDER_SITE_OTHER): Payer: 59 | Admitting: Family Medicine

## 2014-08-25 VITALS — BP 133/82 | HR 84 | Temp 97.0°F | Ht 64.0 in | Wt 287.0 lb

## 2014-08-25 DIAGNOSIS — Z0000 Encounter for general adult medical examination without abnormal findings: Secondary | ICD-10-CM | POA: Diagnosis not present

## 2014-08-25 MED ORDER — FUROSEMIDE 20 MG PO TABS: 20.0000 mg | ORAL_TABLET | Freq: Every day | ORAL | Status: DC

## 2014-08-25 MED ORDER — HYDROCHLOROTHIAZIDE 25 MG PO TABS: 25.0000 mg | ORAL_TABLET | Freq: Every day | ORAL | Status: DC

## 2014-08-25 NOTE — Progress Notes (Signed)
   Subjective:    Patient ID: [name], female    DOB: [birth year], 44 y.o.   MRN: [id number]  HPI Patient is here today for annual wellness exam and follow up of chronic medical problems which includes hypertension. She is taking medications regularly. She takes hydrochlorothiazide. We had tried to substitute chlorthalidone but she did not think that worked any better. She still has some dependent edema right worse than left but she sits all day long at her computer. She gets Pap smears at [facility] and also gets mammograms annually. Last time we checked lab work she had some elevation of her blood sugar and we ask her to lose weight and restrict carbs.      Patient Active Problem List   Diagnosis Date Noted  . Hyperlipidemia   . Abnormal transaminases 12/03/2012  . Unspecified vitamin D deficiency 12/03/2012  . Need for prophylactic vaccination and inoculation against influenza 12/03/2012  . Pedal edema 09/24/2012  . Annual physical exam 09/24/2012  . Screening examination for infectious disease 09/24/2012  . Rash and nonspecific skin eruption 09/24/2012  . Hypertension   . Obesity   . FHx: heart disease   . Cystic fibrosis carrier   . H/O chlamydia infection   . FHx: hypertension   . Abnormal Pap smear   . IUD contraception-in place (per pt due to come out in 2014) 07/15/2011   Outpatient Encounter Prescriptions as of 08/25/2014  Medication Sig  . hydrochlorothiazide (HYDRODIURIL) 25 MG tablet Take 25 mg by mouth daily.  . [DISCONTINUED] chlorthalidone (HYGROTON) 25 MG tablet Take 1 tablet (25 mg total) by mouth daily.  . [DISCONTINUED] TRIAMCINOLONE ACETONIDE, TOP, 0.05 % OINT Apply 1 application topically 2 (two) times daily. For 10 days   No facility-administered encounter medications on file as of 08/25/2014.      Review of Systems  Constitutional: Negative.   HENT: Negative.   Eyes: Negative.   Respiratory: Negative.   Cardiovascular: Negative.     Gastrointestinal: Negative.   Endocrine: Negative.   Genitourinary: Negative.   Musculoskeletal: Negative.   Skin: Negative.   Allergic/Immunologic: Negative.   Neurological: Negative.   Hematological: Negative.   Psychiatric/Behavioral: Negative.        Objective:   Physical Exam  Constitutional: She is oriented to person, place, and time. She appears well-developed and well-nourished.  Patient is obese  HENT:  Head: Normocephalic.  Eyes: Pupils are equal, round, and reactive to light.  Neck: Normal range of motion. Neck supple.  Cardiovascular: Normal rate, regular rhythm, normal heart sounds and intact distal pulses.   Pulmonary/Chest: Effort normal and breath sounds normal.  Abdominal: Soft. Bowel sounds are normal.  Musculoskeletal: Normal range of motion.  Neurological: She is alert and oriented to person, place, and time.  Psychiatric: She has a normal mood and affect. Her behavior is normal.   BP 133/82 mmHg  Pulse 84  Temp(Src) 97 F (36.1 C) (Oral)  Ht $R'5\' 4"'as$  (1.626 m)  Wt 287 lb (130.182 kg)  BMI 49.24 kg/m2        Assessment & Plan:  1. Annual physical exam Except for obesity exam is normal. We will check lipids and fasting blood sugar tomorrow and will add Lasix 20 mg to take once or twice a week for edema and on the days she takes Lasix hold hydrochlorothiazide - BMP8+EGFR; Future - Lipid panel; Future

## 2014-08-25 NOTE — Patient Instructions (Addendum)
Continue current medications. Continue good therapeutic lifestyle changes which include good diet and exercise. Fall precautions discussed with patient. If an FOBT was given today- please return it to our front desk.    After your visit with [place] today you will receive a survey in the mail or online from [employer] regarding your care with [place]. Please take a moment to fill this out. Your feedback is very important to [place] as you can help [place] better understand your patient needs as well as improve your experience and satisfaction. WE CARE ABOUT YOU!!!     Potassium Content of Foods Potassium is a mineral found in many foods and drinks. It helps keep fluids and minerals balanced in your body and affects how steadily your heart beats. Potassium also helps control your blood pressure and keep your muscles and nervous system healthy. Certain health conditions and medicines may change the balance of potassium in your body. When this happens, you can help balance your level of potassium through the foods that you do or do not eat. Your health care provider or dietitian may recommend an amount of potassium that you should have each day. The following lists of foods provide the amount of potassium (in parentheses) per serving in each item. HIGH IN POTASSIUM  The following foods and beverages have 200 mg or more of potassium per serving:  Apricots, 2 raw or 5 dry (200 mg).  Artichoke, 1 medium (345 mg).  Avocado, raw,  each (245 mg).  Banana, 1 medium (425 mg).  Beans, lima, or baked beans, canned,  cup (280 mg).  Beans, white, canned,  cup (595 mg).  Beef roast, 3 oz (320 mg).  Beef, ground, 3 oz (270 mg).  Beets, raw or cooked,  cup (260 mg).  Bran muffin, 2 oz (300 mg).  Broccoli,  cup (230 mg).  Brussels sprouts,  cup (250 mg).  Cantaloupe,  cup (215 mg).  Cereal, 100% bran,  cup (200-400 mg).  Cheeseburger, single, fast food, 1 each (225-400 mg).  Chicken, 3 oz (220  mg).  Clams, canned, 3 oz (535 mg).  Crab, 3 oz (225 mg).  Dates, 5 each (270 mg).  Dried beans and peas,  cup (300-475 mg).  Figs, dried, 2 each (260 mg).  Fish: halibut, tuna, cod, snapper, 3 oz (480 mg).  Fish: salmon, haddock, swordfish, perch, 3 oz (300 mg).  Fish, tuna, canned 3 oz (200 mg).  [place] fries, fast food, 3 oz (470 mg).  Granola with fruit and nuts,  cup (200 mg).  Grapefruit juice,  cup (200 mg).  Greens, beet,  cup (655 mg).  Honeydew melon,  cup (200 mg).  Kale, raw, 1 cup (300 mg).  Kiwi, 1 medium (240 mg).  Kohlrabi, rutabaga, parsnips,  cup (280 mg).  Lentils,  cup (365 mg).  Mango, 1 each (325 mg).  Milk, chocolate, 1 cup (420 mg).  Milk: nonfat, low-fat, whole, buttermilk, 1 cup (350-380 mg).  Molasses, 1 Tbsp (295 mg).  Mushrooms,  cup (280) mg.  Nectarine, 1 each (275 mg).  Nuts: almonds, peanuts, hazelnuts, [place], cashew, mixed, 1 oz (200 mg).  Nuts, pistachios, 1 oz (295 mg).  Orange, 1 each (240 mg).  Orange juice,  cup (235 mg).  Papaya, medium,  fruit (390 mg).  Peanut butter, chunky, 2 Tbsp (240 mg).  Peanut butter, smooth, 2 Tbsp (210 mg).  Pear, 1 medium (200 mg).  Pomegranate, 1 whole (400 mg).  Pomegranate juice,  cup (215 mg).  Pork, 3 oz (350 mg).  Potato chips, salted, 1 oz (465 mg).  Potato, baked with skin, 1 medium (925 mg).  Potatoes, boiled,  cup (255 mg).  Potatoes, mashed,  cup (330 mg).  Prune juice,  cup (370 mg).  Prunes, 5 each (305 mg).  Pudding, chocolate,  cup (230 mg).  Pumpkin, canned,  cup (250 mg).  Raisins, seedless,  cup (270 mg).  Seeds, sunflower or pumpkin, 1 oz (240 mg).  Soy milk, 1 cup (300 mg).  Spinach,  cup (420 mg).  Spinach, canned,  cup (370 mg).  Sweet potato, baked with skin, 1 medium (450 mg).  Swiss chard,  cup (480 mg).  Tomato or vegetable juice,  cup (275 mg).  Tomato sauce or puree,  cup (400-550 mg).  Tomato,  raw, 1 medium (290 mg).  Tomatoes, canned,  cup (200-300 mg).  [place], 3 oz (250 mg).  Wheat germ, 1 oz (250 mg).  Winter squash,  cup (250 mg).  Yogurt, plain or fruited, 6 oz (260-435 mg).  Zucchini,  cup (220 mg). MODERATE IN POTASSIUM The following foods and beverages have 50-200 mg of potassium per serving:  Apple, 1 each (150 mg).  Apple juice,  cup (150 mg).  Applesauce,  cup (90 mg).  Apricot nectar,  cup (140 mg).  Asparagus, small spears,  cup or 6 spears (155 mg).  Bagel, cinnamon raisin, 1 each (130 mg).  Bagel, egg or plain, 4 in., 1 each (70 mg).  Beans, green,  cup (90 mg).  Beans, yellow,  cup (190 mg).  Beer, regular, 12 oz (100 mg).  Beets, canned,  cup (125 mg).  Blackberries,  cup (115 mg).  Blueberries,  cup (60 mg).  Bread, whole wheat, 1 slice (70 mg).  Broccoli, raw,  cup (145 mg).  Cabbage,  cup (150 mg).  Carrots, cooked or raw,  cup (180 mg).  Cauliflower, raw,  cup (150 mg).  Celery, raw,  cup (155 mg).  Cereal, bran flakes, cup (120-150 mg).  Cheese, cottage,  cup (110 mg).  Cherries, 10 each (150 mg).  Chocolate, 1 oz bar (165 mg).  Coffee, brewed 6 oz (90 mg).  Corn,  cup or 1 ear (195 mg).  Cucumbers,  cup (80 mg).  Egg, large, 1 each (60 mg).  Eggplant,  cup (60 mg).  Endive, raw, cup (80 mg).  English muffin, 1 each (65 mg).  Fish, orange roughy, 3 oz (150 mg).  Frankfurter, beef or pork, 1 each (75 mg).  Fruit cocktail,  cup (115 mg).  Grape juice,  cup (170 mg).  Grapefruit,  fruit (175 mg).  Grapes,  cup (155 mg).  Greens: kale, turnip, collard,  cup (110-150 mg).  Ice cream or frozen yogurt, chocolate,  cup (175 mg).  Ice cream or frozen yogurt, vanilla,  cup (120-150 mg).  Lemons, limes, 1 each (80 mg).  Lettuce, all types, 1 cup (100 mg).  Mixed vegetables,  cup (150 mg).  Mushrooms, raw,  cup (110 mg).  Nuts: walnuts, pecans, or macadamia, 1  oz (125 mg).  Oatmeal,  cup (80 mg).  Okra,  cup (110 mg).  Onions, raw,  cup (120 mg).  Peach, 1 each (185 mg).  Peaches, canned,  cup (120 mg).  Pears, canned,  cup (120 mg).  Peas, green, frozen,  cup (90 mg).  Peppers, green,  cup (130 mg).  Peppers, red,  cup (160 mg).  Pineapple juice,  cup (165 mg).  Pineapple, fresh or  canned,  cup (100 mg).  Plums, 1 each (105 mg).  Pudding, vanilla,  cup (150 mg).  Raspberries,  cup (90 mg).  Rhubarb,  cup (115 mg).  Rice, wild,  cup (80 mg).  Shrimp, 3 oz (155 mg).  Spinach, raw, 1 cup (170 mg).  Strawberries,  cup (125 mg).  Summer squash  cup (175-200 mg).  Swiss chard, raw, 1 cup (135 mg).  Tangerines, 1 each (140 mg).  Tea, brewed, 6 oz (65 mg).  Turnips,  cup (140 mg).  Watermelon,  cup (85 mg).  Wine, red, table, 5 oz (180 mg).  Wine, white, table, 5 oz (100 mg). LOW IN POTASSIUM The following foods and beverages have less than 50 mg of potassium per serving.  Bread, white, 1 slice (30 mg).  Carbonated beverages, 12 oz (less than 5 mg).  Cheese, 1 oz (20-30 mg).  Cranberries,  cup (45 mg).  Cranberry juice cocktail,  cup (20 mg).  Fats and oils, 1 Tbsp (less than 5 mg).  Hummus, 1 Tbsp (32 mg).  Nectar: papaya, mango, or pear,  cup (35 mg).  Rice, white or brown,  cup (50 mg).  Spaghetti or macaroni,  cup cooked (30 mg).  Tortilla, flour or corn, 1 each (50 mg).  Waffle, 4 in., 1 each (50 mg).  Water chestnuts,  cup (40 mg). Document Released: 09/07/2004 Document Revised: 01/29/2013 Document Reviewed: 12/21/2012 [facility] Patient Information 2015 [employer] [place]. This information is not intended to replace advice given to you by your health care provider. Make sure you discuss any questions you have with your health care provider.

## 2014-08-26 ENCOUNTER — Other Ambulatory Visit: Payer: 59

## 2014-08-26 DIAGNOSIS — Z0000 Encounter for general adult medical examination without abnormal findings: Secondary | ICD-10-CM

## 2014-08-27 LAB — BMP8+EGFR
BUN / CREAT RATIO: 15 (ref 9–23)
BUN: 13 mg/dL (ref 6–24)
CHLORIDE: 99 mmol/L (ref 97–108)
CO2: 22 mmol/L (ref 18–29)
CREATININE: 0.85 mg/dL (ref 0.57–1.00)
Calcium: 8.9 mg/dL (ref 8.7–10.2)
GFR calc Af Amer: 96 mL/min/{1.73_m2} (ref >59)
GFR, EST NON AFRICAN AMERICAN: 84 mL/min/{1.73_m2} (ref >59)
Glucose: 104 mg/dL — ABNORMAL HIGH (ref 65–99)
POTASSIUM: 4.2 mmol/L (ref 3.5–5.2)
Sodium: 137 mmol/L (ref 134–144)

## 2014-08-27 LAB — LIPID PANEL
CHOLESTEROL TOTAL: 176 mg/dL (ref 100–199)
Chol/HDL Ratio: 4.3 ratio units (ref 0.0–4.4)
HDL: 41 mg/dL (ref >39)
LDL CALC: 107 mg/dL — AB (ref 0–99)
Triglycerides: 141 mg/dL (ref 0–149)
VLDL CHOLESTEROL CAL: 28 mg/dL (ref 5–40)

## 2015-02-06 ENCOUNTER — Encounter: Payer: Self-pay | Admitting: Family Medicine

## 2015-04-28 ENCOUNTER — Ambulatory Visit (INDEPENDENT_AMBULATORY_CARE_PROVIDER_SITE_OTHER): Payer: 59

## 2015-04-28 DIAGNOSIS — Z23 Encounter for immunization: Secondary | ICD-10-CM | POA: Diagnosis not present

## 2015-06-24 ENCOUNTER — Encounter: Payer: Self-pay | Admitting: Family Medicine

## 2015-06-24 ENCOUNTER — Ambulatory Visit (INDEPENDENT_AMBULATORY_CARE_PROVIDER_SITE_OTHER): Payer: 59 | Admitting: Family Medicine

## 2015-06-24 VITALS — BP 132/84 | HR 87 | Temp 97.1°F | Ht 64.0 in | Wt 284.6 lb

## 2015-06-24 DIAGNOSIS — R600 Localized edema: Secondary | ICD-10-CM | POA: Diagnosis not present

## 2015-06-24 DIAGNOSIS — I10 Essential (primary) hypertension: Secondary | ICD-10-CM | POA: Diagnosis not present

## 2015-06-24 MED ORDER — MUPIROCIN CALCIUM 2 % EX CREA: 1.0000 "application " | TOPICAL_CREAM | Freq: Two times a day (BID) | CUTANEOUS | Status: DC

## 2015-06-24 NOTE — Progress Notes (Signed)
   Subjective:    Patient ID: [name], female    DOB: [birth year], 45 y.o.   MRN: [id number]  HPI 45 year old female who has a long-standing problem with edema in her right foot and ankle probably related to venous stasis disease. She takes hydrochlorothiazide for blood pressure but also has Rx for Lasix to take when necessary edema. She works at home and sits at a desk most of the day with her feet in a dependent position. Recently her employer has said that rather than sitting they would help her get a standup desk. We discussed the virtual standing up in one spot versus sitting and I am not sure I see a big event age both can lead to edema. I think however if she were standing in using a small stepping stool or some motion in her legs that this could be beneficial.  Patient Active Problem List   Diagnosis Date Noted  . Hyperlipidemia   . Abnormal transaminases 12/03/2012  . Unspecified vitamin D deficiency 12/03/2012  . Need for prophylactic vaccination and inoculation against influenza 12/03/2012  . Pedal edema 09/24/2012  . Annual physical exam 09/24/2012  . Screening examination for infectious disease 09/24/2012  . Rash and nonspecific skin eruption 09/24/2012  . Hypertension   . Obesity   . FHx: heart disease   . Cystic fibrosis carrier   . H/O chlamydia infection   . FHx: hypertension   . Abnormal Pap smear   . IUD contraception-in place (per pt due to come out in 2014) 07/15/2011   Outpatient Encounter Prescriptions as of 06/24/2015  Medication Sig  . furosemide (LASIX) 20 MG tablet Take 1 tablet (20 mg total) by mouth daily. As directed  . hydrochlorothiazide (HYDRODIURIL) 25 MG tablet Take 1 tablet (25 mg total) by mouth daily.   No facility-administered encounter medications on file as of 06/24/2015.      Review of Systems  Constitutional: Negative.   Respiratory: Negative.   Cardiovascular: Positive for leg swelling.  Genitourinary: Negative.     Psychiatric/Behavioral: Negative.        Objective:   Physical Exam  Constitutional: She appears well-developed and well-nourished.  Cardiovascular: Normal rate and regular rhythm.   Pulmonary/Chest: Effort normal and breath sounds normal.  Musculoskeletal: She exhibits edema (right ankle and foot 2+ edematous).          Assessment & Plan:  1. Essential hypertension Blood pressure is controlled with hydrochlorothiazide. No changes recommended  2. Pedal edema Patient has said that Lasix does not make her kidneys work anymore she could try doubling up on the Lasix or we could try different loop diuretic. She elected to double up for that is only to be used when necessary. I did write the prescription for the standup desk as long as she uses it was some sort of stepping activity to avoid standing in one place for a long period of time  Frederica KusterStephen M Miller MD

## 2015-08-23 ENCOUNTER — Other Ambulatory Visit: Payer: Self-pay | Admitting: Family Medicine

## 2015-08-24 ENCOUNTER — Telehealth: Payer: Self-pay | Admitting: Family Medicine

## 2015-08-24 NOTE — Telephone Encounter (Signed)
Patient aware that last cream that was sent was Bactroban on 06/24/15.

## 2015-09-02 ENCOUNTER — Telehealth: Payer: Self-pay | Admitting: Family Medicine

## 2015-09-03 MED ORDER — MUPIROCIN 2 % EX OINT: 1.0000 "application " | TOPICAL_OINTMENT | Freq: Two times a day (BID) | CUTANEOUS | 0 refills | Status: DC

## 2015-09-03 NOTE — Telephone Encounter (Signed)
Please change to ointment.

## 2015-11-12 ENCOUNTER — Other Ambulatory Visit: Payer: Self-pay | Admitting: Family Medicine

## 2016-02-01 ENCOUNTER — Other Ambulatory Visit: Payer: Self-pay | Admitting: Family Medicine

## 2016-02-23 ENCOUNTER — Ambulatory Visit (INDEPENDENT_AMBULATORY_CARE_PROVIDER_SITE_OTHER): Payer: 59 | Admitting: *Deleted

## 2016-02-23 ENCOUNTER — Ambulatory Visit (INDEPENDENT_AMBULATORY_CARE_PROVIDER_SITE_OTHER): Payer: 59 | Admitting: Pediatrics

## 2016-02-23 ENCOUNTER — Encounter: Payer: Self-pay | Admitting: Pediatrics

## 2016-02-23 VITALS — BP 144/96 | HR 96 | Temp 98.6°F | Ht 64.0 in | Wt 286.4 lb

## 2016-02-23 DIAGNOSIS — Z0000 Encounter for general adult medical examination without abnormal findings: Secondary | ICD-10-CM | POA: Diagnosis not present

## 2016-02-23 DIAGNOSIS — I10 Essential (primary) hypertension: Secondary | ICD-10-CM

## 2016-02-23 DIAGNOSIS — Z23 Encounter for immunization: Secondary | ICD-10-CM | POA: Diagnosis not present

## 2016-02-23 DIAGNOSIS — Z6841 Body Mass Index (BMI) 40.0 and over, adult: Secondary | ICD-10-CM | POA: Diagnosis not present

## 2016-02-23 LAB — BAYER DCA HB A1C WAIVED: HB A1C (BAYER DCA - WAIVED): 6.5 % (ref <7.0)

## 2016-02-23 MED ORDER — CHLORTHALIDONE 50 MG PO TABS: 50.0000 mg | ORAL_TABLET | Freq: Every day | ORAL | 1 refills | Status: DC

## 2016-02-23 MED ORDER — PHENTERMINE HCL 15 MG PO CAPS: 15.0000 mg | ORAL_CAPSULE | ORAL | 0 refills | Status: DC

## 2016-02-23 NOTE — Progress Notes (Signed)
  Subjective:   Patient ID: [name], female    DOB: [birth year], 46 y.o.   MRN: [id number] CC: Annual Exam  HPI: [name] is a 46 y.o. female presenting for Annual Exam  HTN: been on HCTZ for a while No headaches, no vision changes No CP, no SOB Doesn't check BPs at home Mom has a BP cuff  BMI elevated: Mostly water to drink Drinks some tea Minimal exercise now Says she knows what to do but isnt doing it Portion control is a problem Minimal snacking Eats three meals a day  Lives at home with two sons, one 65yo Regular periods  Relevant past medical, surgical, family and social history reviewed. Allergies and medications reviewed and updated. History  Smoking Status  . Never Smoker  Smokeless Tobacco  . Never Used   ROS: All systems negative other than what is in HPI  Objective:    BP (!) 144/96   Pulse 96   Temp 98.6 F (37 C) (Oral)   Ht _0  (1.626 m)   Wt 286 lb 6.4 oz (129.9 kg)   BMI 49.16 kg/m   Wt Readings from Last 3 Encounters:  02/23/16 286 lb 6.4 oz (129.9 kg)  06/24/15 284 lb 9.6 oz (129.1 kg)  08/25/14 287 lb (130.2 kg)    Gen: NAD, alert, cooperative with exam, NCAT EYES: EOMI, no conjunctival injection, or no icterus ENT:  TMs pearly gray b/l, OP without erythema LYMPH: no cervical LAD NECK: nl thyroid CV: NRRR, normal S1/S2, no murmur, distal pulses 2+ b/l Resp: CTABL, no wheezes, normal WOB Abd: +BS, soft, NTND. no guarding or organomegaly Ext: No edema, warm Neuro: Alert and oriented, strength equal b/l UE and LE, coordination grossly normal MSK: normal muscle bulk  Assessment & Plan:  [name] was seen today for annual exam.  Diagnoses and all orders for this visit:  Encounter for preventive health examination UTD mammogram, pap smear No fam h/o colon ca -     Bayer DCA Hb A1c Waived -     TSH  Essential hypertension Stop HCTZ start below Check at home Goal 120s/70s Let me know if persistently elevated -      chlorthalidone (HYGROTON) 50 MG tablet; Take 1 tablet (50 mg total) by mouth daily. -     CMP14+EGFR  BMI 45.0-49.9, adult (HCC) Discussed lifestyle changes Once bp improved start below If any side effects stop and let m eknow -     phentermine 15 MG capsule; Take 1 capsule (15 mg total) by mouth every morning.  Follow up plan: Return in about 4 weeks (around 03/22/2016). Assunta Found, MD [place]

## 2016-02-23 NOTE — Patient Instructions (Signed)
Stop hydrochlorothiazide Start chlorthalidone Start phentermine in the morning before breakfast Check BPs at home, let me know if regularly >140s or >90s Write down numbers, bring to next visit Goal is 120s/70s

## 2016-02-24 LAB — CMP14+EGFR
ALBUMIN: 4.2 g/dL (ref 3.5–5.5)
ALK PHOS: 79 IU/L (ref 39–117)
ALT: 32 IU/L (ref 0–32)
AST: 22 IU/L (ref 0–40)
Albumin/Globulin Ratio: 1.3 (ref 1.2–2.2)
BILIRUBIN TOTAL: 0.5 mg/dL (ref 0.0–1.2)
BUN / CREAT RATIO: 15 (ref 9–23)
BUN: 14 mg/dL (ref 6–24)
CHLORIDE: 99 mmol/L (ref 96–106)
CO2: 26 mmol/L (ref 18–29)
CREATININE: 0.93 mg/dL (ref 0.57–1.00)
Calcium: 9.8 mg/dL (ref 8.7–10.2)
GFR calc Af Amer: 86 mL/min/{1.73_m2} (ref >59)
GFR calc non Af Amer: 74 mL/min/{1.73_m2} (ref >59)
GLOBULIN, TOTAL: 3.3 g/dL (ref 1.5–4.5)
Glucose: 96 mg/dL (ref 65–99)
POTASSIUM: 4.4 mmol/L (ref 3.5–5.2)
SODIUM: 142 mmol/L (ref 134–144)
Total Protein: 7.5 g/dL (ref 6.0–8.5)

## 2016-02-24 LAB — TSH: TSH: 3.3 u[IU]/mL (ref 0.450–4.500)

## 2016-02-29 ENCOUNTER — Other Ambulatory Visit: Payer: Self-pay | Admitting: Pediatrics

## 2016-02-29 DIAGNOSIS — E119 Type 2 diabetes mellitus without complications: Secondary | ICD-10-CM

## 2016-02-29 MED ORDER — METFORMIN HCL 500 MG PO TABS: 500.0000 mg | ORAL_TABLET | Freq: Two times a day (BID) | ORAL | 6 refills | Status: DC

## 2016-04-22 ENCOUNTER — Other Ambulatory Visit: Payer: Self-pay | Admitting: Physician Assistant

## 2016-05-02 ENCOUNTER — Other Ambulatory Visit: Payer: Self-pay | Admitting: Pediatrics

## 2016-05-02 DIAGNOSIS — I10 Essential (primary) hypertension: Secondary | ICD-10-CM

## 2016-07-14 ENCOUNTER — Other Ambulatory Visit: Payer: Self-pay | Admitting: Pediatrics

## 2016-08-30 ENCOUNTER — Other Ambulatory Visit: Payer: Self-pay | Admitting: Pediatrics

## 2016-08-30 DIAGNOSIS — I10 Essential (primary) hypertension: Secondary | ICD-10-CM

## 2016-10-03 ENCOUNTER — Other Ambulatory Visit: Payer: Self-pay | Admitting: Pediatrics

## 2016-10-05 NOTE — Telephone Encounter (Signed)
Last seen 02/23/16  Dr Hyacinth Meeker

## 2016-10-05 NOTE — Telephone Encounter (Signed)
Authorize 30 days only. Then contact the patient letting them know that they will need an appointment before any further prescriptions can be sent in. 

## 2016-12-26 ENCOUNTER — Other Ambulatory Visit: Payer: Self-pay | Admitting: Family Medicine

## 2016-12-27 NOTE — Telephone Encounter (Signed)
Last seen 02/23/16  Dr Hyacinth MeekerMiller PCP

## 2016-12-27 NOTE — Telephone Encounter (Signed)
BP not at goal at last visit.  Patient needs OV for HTN.  1 month supply sent to pharmacy.

## 2017-02-24 ENCOUNTER — Ambulatory Visit (INDEPENDENT_AMBULATORY_CARE_PROVIDER_SITE_OTHER): Payer: 59 | Admitting: Pediatrics

## 2017-02-24 ENCOUNTER — Encounter: Payer: Self-pay | Admitting: Pediatrics

## 2017-02-24 VITALS — BP 136/82 | HR 98 | Temp 99.1°F | Ht 64.0 in | Wt 288.4 lb

## 2017-02-24 DIAGNOSIS — Z23 Encounter for immunization: Secondary | ICD-10-CM | POA: Diagnosis not present

## 2017-02-24 DIAGNOSIS — R600 Localized edema: Secondary | ICD-10-CM

## 2017-02-24 DIAGNOSIS — Z0000 Encounter for general adult medical examination without abnormal findings: Secondary | ICD-10-CM

## 2017-02-24 DIAGNOSIS — I10 Essential (primary) hypertension: Secondary | ICD-10-CM

## 2017-02-24 DIAGNOSIS — Z6841 Body Mass Index (BMI) 40.0 and over, adult: Secondary | ICD-10-CM

## 2017-02-24 DIAGNOSIS — E559 Vitamin D deficiency, unspecified: Secondary | ICD-10-CM

## 2017-02-24 DIAGNOSIS — E119 Type 2 diabetes mellitus without complications: Secondary | ICD-10-CM

## 2017-02-24 DIAGNOSIS — E785 Hyperlipidemia, unspecified: Secondary | ICD-10-CM

## 2017-02-24 LAB — BAYER DCA HB A1C WAIVED: HB A1C: 8.5 % — AB (ref <7.0)

## 2017-02-24 MED ORDER — HYDROCHLOROTHIAZIDE 25 MG PO TABS: 25.0000 mg | ORAL_TABLET | Freq: Every day | ORAL | 1 refills | Status: DC

## 2017-02-24 MED ORDER — METFORMIN HCL 500 MG PO TABS: 500.0000 mg | ORAL_TABLET | Freq: Two times a day (BID) | ORAL | 6 refills | Status: DC

## 2017-02-24 NOTE — Progress Notes (Signed)
  Subjective:   Patient ID: [name], female    DOB: 02-20-1970, 47 y.o.   MRN: [id number] CC: Annual Exam  HPI: [name] is a 47 y.o. female presenting for Annual Exam  DM2: Cooks a lot at home. Not drinking any sweet drinks or sodas Not regularly walking or exercising, says that she would be able to fit it into her day but she received medication for.  She works from home.  Lower extremity swelling: Worse at the end of the day.  Was prescribed a standing desk by her prior PCP, never did fill it, would like to try that.  Fasting today  HTN: taking HCTZ regularly No chest pain, no headaches  Relevant past medical, surgical, family and social history reviewed. Allergies and medications reviewed and updated. Social History   Tobacco Use  Smoking Status Never Smoker  Smokeless Tobacco Never Used   ROS: All systems negative other than what is in the HPI  Objective:    BP 136/82   Pulse 98   Temp 99.1 F (37.3 C) (Oral)   Ht '5\' 4"'$  (1.626 m)   Wt 288 lb 6.4 oz (130.8 kg)   BMI 49.50 kg/m   Wt Readings from Last 3 Encounters:  02/24/17 288 lb 6.4 oz (130.8 kg)  02/23/16 286 lb 6.4 oz (129.9 kg)  06/24/15 284 lb 9.6 oz (129.1 kg)    Gen: NAD, alert, cooperative with exam, NCAT EYES: EOMI, no conjunctival injection, or no icterus ENT:  TMs pearly gray b/l, OP without erythema LYMPH: no cervical LAD CV: NRRR, normal S1/S2, no murmur, distal pulses 2+ b/l Resp: CTABL, no wheezes, normal WOB Abd: +BS, soft, NTND. no guarding or organomegaly Ext:  trace pitting edema bilateral lower extremity, warm, foot exam normal bilaterally Neuro: Alert and oriented, strength equal b/l UE and LE, coordination grossly normal MSK: normal muscle bulk  Assessment & Plan:  [name] was seen today for annual exam.  Diagnoses and all orders for this visit:  Encounter for preventive health examination  Type 2 diabetes mellitus without complication, without long-term current use of  insulin ([place]) Take metformin twice a day If A1c elevated will likely add additional medicine -     metFORMIN (GLUCOPHAGE) 500 MG tablet; Take 1 tablet (500 mg total) by mouth 2 (two) times daily with a meal. -     Bayer DCA Hb A1c Waived -     Microalbumin / creatinine urine ratio  Essential hypertension Slightly elevated today, continue hydrochlorothiazide Check numbers at home when able -     CMP14+EGFR -     hydrochlorothiazide (HYDRODIURIL) 25 MG tablet; Take 1 tablet (25 mg total) by mouth daily.  Pedal edema Wrote prescription for standing desk, discussed patient would need to move her feet regularly if using, okay to switch between standing and sitting desk take frequent breaks to go.  For walks, use compression hose as needed, propped up feet as able when she is sitting down.  Hyperlipidemia, unspecified hyperlipidemia type -     Lipid panel  Vitamin D deficiency -     VITAMIN D 25 Hydroxy (Vit-D Deficiency, Fractures)  Need for immunization against influenza -     Flu Vaccine QUAD 36+ mos IM   Follow up plan: Return in about 3 months (around 05/25/2017). Assunta Found, MD [place]

## 2017-02-25 LAB — LIPID PANEL
CHOL/HDL RATIO: 3.8 ratio (ref 0.0–4.4)
Cholesterol, Total: 172 mg/dL (ref 100–199)
HDL: 45 mg/dL (ref >39)
LDL CALC: 83 mg/dL (ref 0–99)
TRIGLYCERIDES: 222 mg/dL — AB (ref 0–149)
VLDL CHOLESTEROL CAL: 44 mg/dL — AB (ref 5–40)

## 2017-02-25 LAB — CMP14+EGFR
ALBUMIN: 4.2 g/dL (ref 3.5–5.5)
ALT: 43 IU/L — AB (ref 0–32)
AST: 32 IU/L (ref 0–40)
Albumin/Globulin Ratio: 1.5 (ref 1.2–2.2)
Alkaline Phosphatase: 67 IU/L (ref 39–117)
BILIRUBIN TOTAL: 0.4 mg/dL (ref 0.0–1.2)
BUN / CREAT RATIO: 14 (ref 9–23)
BUN: 12 mg/dL (ref 6–24)
CALCIUM: 9.7 mg/dL (ref 8.7–10.2)
CO2: 22 mmol/L (ref 20–29)
CREATININE: 0.85 mg/dL (ref 0.57–1.00)
Chloride: 97 mmol/L (ref 96–106)
GFR calc Af Amer: 95 mL/min/{1.73_m2} (ref >59)
GFR, EST NON AFRICAN AMERICAN: 82 mL/min/{1.73_m2} (ref >59)
GLUCOSE: 140 mg/dL — AB (ref 65–99)
Globulin, Total: 2.8 g/dL (ref 1.5–4.5)
Potassium: 3.9 mmol/L (ref 3.5–5.2)
Sodium: 138 mmol/L (ref 134–144)
TOTAL PROTEIN: 7 g/dL (ref 6.0–8.5)

## 2017-02-25 LAB — MICROALBUMIN / CREATININE URINE RATIO
Creatinine, Urine: 172.8 mg/dL
Microalb/Creat Ratio: 74.5 mg/g creat — ABNORMAL HIGH (ref 0.0–30.0)
Microalbumin, Urine: 128.7 ug/mL

## 2017-02-25 LAB — VITAMIN D 25 HYDROXY (VIT D DEFICIENCY, FRACTURES): Vit D, 25-Hydroxy: 29.5 ng/mL — ABNORMAL LOW (ref 30.0–100.0)

## 2017-02-27 ENCOUNTER — Other Ambulatory Visit: Payer: Self-pay | Admitting: Pediatrics

## 2017-02-27 DIAGNOSIS — E119 Type 2 diabetes mellitus without complications: Secondary | ICD-10-CM | POA: Insufficient documentation

## 2017-02-27 DIAGNOSIS — R809 Proteinuria, unspecified: Principal | ICD-10-CM

## 2017-02-27 DIAGNOSIS — E1129 Type 2 diabetes mellitus with other diabetic kidney complication: Secondary | ICD-10-CM

## 2017-02-27 MED ORDER — SITAGLIPTIN PHOSPHATE 100 MG PO TABS: 100.0000 mg | ORAL_TABLET | Freq: Every day | ORAL | 2 refills | Status: DC

## 2017-02-27 MED ORDER — LISINOPRIL 10 MG PO TABS
10.0000 mg | ORAL_TABLET | Freq: Every day | ORAL | 3 refills | Status: DC
Start: 2017-02-27 — End: 2017-09-21

## 2017-02-28 ENCOUNTER — Telehealth: Payer: Self-pay

## 2017-02-28 ENCOUNTER — Telehealth: Payer: Self-pay | Admitting: Pediatrics

## 2017-02-28 NOTE — Telephone Encounter (Signed)
Pt would like to know if she needs to take all 4 meds together - metforman, lisinopril, Hydrodiuril, and Januva? Please advise.

## 2017-02-28 NOTE — Telephone Encounter (Signed)
Please advise 

## 2017-02-28 NOTE — Telephone Encounter (Signed)
It is fine to take all of them same time in the morning

## 2017-02-28 NOTE — Telephone Encounter (Signed)
Pt notified of recommendation Verbalizes understanding 

## 2017-02-28 NOTE — Telephone Encounter (Signed)
Insurance denied Januvia   Must have a failure to Franceradjenta

## 2017-03-03 MED ORDER — LINAGLIPTIN 5 MG PO TABS: 5.0000 mg | ORAL_TABLET | Freq: Every day | ORAL | 3 refills | Status: DC

## 2017-03-03 NOTE — Telephone Encounter (Signed)
aware

## 2017-03-03 NOTE — Addendum Note (Signed)
Addended by: Johna SheriffVINCENT, Karrington Mccravy L on: 03/03/2017 01:48 PM   Modules accepted: Orders

## 2017-03-03 NOTE — Telephone Encounter (Signed)
Patient aware and states that she came in 2 days ago and got some samples of Januvia and was going to take those.

## 2017-03-03 NOTE — Telephone Encounter (Signed)
That's fine, take the [place]. dont pick up tradjenta

## 2017-05-25 ENCOUNTER — Encounter: Payer: Self-pay | Admitting: Pediatrics

## 2017-05-25 ENCOUNTER — Ambulatory Visit (INDEPENDENT_AMBULATORY_CARE_PROVIDER_SITE_OTHER): Payer: 59 | Admitting: Pediatrics

## 2017-05-25 VITALS — BP 126/84 | HR 92 | Temp 99.5°F | Ht 64.0 in | Wt 271.4 lb

## 2017-05-25 DIAGNOSIS — Z6841 Body Mass Index (BMI) 40.0 and over, adult: Secondary | ICD-10-CM

## 2017-05-25 DIAGNOSIS — I10 Essential (primary) hypertension: Secondary | ICD-10-CM

## 2017-05-25 DIAGNOSIS — E119 Type 2 diabetes mellitus without complications: Secondary | ICD-10-CM

## 2017-05-25 LAB — BASIC METABOLIC PANEL
BUN/Creatinine Ratio: 13 (ref 9–23)
BUN: 11 mg/dL (ref 6–24)
CALCIUM: 10 mg/dL (ref 8.7–10.2)
CHLORIDE: 98 mmol/L (ref 96–106)
CO2: 24 mmol/L (ref 20–29)
CREATININE: 0.88 mg/dL (ref 0.57–1.00)
GFR calc non Af Amer: 78 mL/min/{1.73_m2} (ref >59)
GFR, EST AFRICAN AMERICAN: 90 mL/min/{1.73_m2} (ref >59)
Glucose: 104 mg/dL — ABNORMAL HIGH (ref 65–99)
Potassium: 4.2 mmol/L (ref 3.5–5.2)
Sodium: 137 mmol/L (ref 134–144)

## 2017-05-25 LAB — BAYER DCA HB A1C WAIVED: HB A1C: 6.3 % (ref <7.0)

## 2017-05-25 MED ORDER — LINAGLIPTIN 5 MG PO TABS: 5.0000 mg | ORAL_TABLET | Freq: Every day | ORAL | 3 refills | Status: DC

## 2017-05-25 NOTE — Progress Notes (Signed)
  Subjective:   Patient ID: [name], female    DOB: 04-08-1970, 47 y.o.   MRN: [id number] CC: Follow-up (3 month)  HPI: [name] is a 47 y.o. female presenting for Follow-up (3 month)  Diabetes: Decreased starch and carbohydrate intake.  Taking Tradjenta every other day due to expense.  Taking metformin once most days.  Hypertension: Taking medicines regularly.  No chest pain or headaches.  Elevated BMI: Increasing vegetables. Has been pleased with weight loss.  Hoping to get more active in the next few weeks.  Relevant past medical, surgical, family and social history reviewed. Allergies and medications reviewed and updated. Social History   Tobacco Use  Smoking Status Never Smoker  Smokeless Tobacco Never Used   ROS: Per HPI   Objective:    BP 126/84   Pulse 92   Temp 99.5 F (37.5 C) (Oral)   Ht 5\' 4"  (1.626 m)   Wt 271 lb 6.4 oz (123.1 kg)   BMI 46.59 kg/m   Wt Readings from Last 3 Encounters:  05/25/17 271 lb 6.4 oz (123.1 kg)  02/24/17 288 lb 6.4 oz (130.8 kg)  02/23/16 286 lb 6.4 oz (129.9 kg)    Gen: NAD, alert, cooperative with exam, NCAT EYES: EOMI, no conjunctival injection, or no icterus ENT:  OP without erythema LYMPH: no cervical LAD CV: NRRR, normal S1/S2, no murmur, distal pulses 2+ b/l Resp: CTABL, no wheezes, normal WOB Abd: +BS, soft, NTND. no guarding or organomegaly Ext: No edema, warm Neuro: Alert and oriented, strength equal b/l UE and LE, coordination grossly normal MSK: normal muscle bulk  Assessment & Plan:  [name] was seen today for follow-up multiple medical problems.  Diagnoses and all orders for this visit:  Type 2 diabetes mellitus without complication, without long-term current use of insulin (HCC) A1c improved, now 6.3 from 8.5 last check.  Continue current medicines.  Continue current lifestyle changes. -     Bayer DCA Hb A1c Waived -     linagliptin (TRADJENTA) 5 MG TABS tablet; Take 1 tablet (5 mg total) by mouth  daily.  Essential hypertension Stable, continue meds -     Basic Metabolic Panel  Elevated BMI Continue lifestyle changes.  Increase physical activity.  Follow up plan: Return in about 3 months (around 08/24/2017). Rex Krasarol Britanie Harshman, MD [facility]

## 2017-08-24 ENCOUNTER — Ambulatory Visit: Payer: 59 | Admitting: Pediatrics

## 2017-09-21 ENCOUNTER — Ambulatory Visit (INDEPENDENT_AMBULATORY_CARE_PROVIDER_SITE_OTHER): Payer: 59 | Admitting: Pediatrics

## 2017-09-21 ENCOUNTER — Encounter: Payer: Self-pay | Admitting: Pediatrics

## 2017-09-21 VITALS — BP 134/87 | HR 95 | Temp 99.5°F | Ht 64.0 in | Wt 272.0 lb

## 2017-09-21 DIAGNOSIS — E119 Type 2 diabetes mellitus without complications: Secondary | ICD-10-CM

## 2017-09-21 DIAGNOSIS — I10 Essential (primary) hypertension: Secondary | ICD-10-CM | POA: Diagnosis not present

## 2017-09-21 DIAGNOSIS — M7989 Other specified soft tissue disorders: Secondary | ICD-10-CM

## 2017-09-21 MED ORDER — LINAGLIPTIN 5 MG PO TABS: 5.0000 mg | ORAL_TABLET | Freq: Every day | ORAL | 3 refills | Status: DC

## 2017-09-21 MED ORDER — FUROSEMIDE 20 MG PO TABS: 20.0000 mg | ORAL_TABLET | Freq: Every day | ORAL | 3 refills | Status: DC

## 2017-09-21 MED ORDER — LISINOPRIL 20 MG PO TABS: 20.0000 mg | ORAL_TABLET | Freq: Every day | ORAL | 5 refills | Status: DC

## 2017-09-21 NOTE — Progress Notes (Signed)
  Subjective:   Patient ID: [name], female    DOB: Jun 30, 1970, 47 y.o.   MRN: [id number] CC: Foot Swelling  HPI: Mariah PockWendy L Sindt is a 47 y.o. female   Has had swelling off and on in her feet, mostly right foot past few years.  For the last 2 weeks left foot is also been swelling.  Feels tight.  No injuries.  She has a sedentary job.  She spends most of day sitting.  Not regularly walking.  Swelling goes down in the morning after sleeping flat at night.  Within an hour of sitting swelling is back.  No shortness of breath or chest pain.  Relevant past medical, surgical, family and social history reviewed. Allergies and medications reviewed and updated. Social History   Tobacco Use  Smoking Status Never Smoker  Smokeless Tobacco Never Used   ROS: Per HPI   Objective:    BP 134/87   Pulse 95   Temp 99.5 F (37.5 C) (Oral)   Ht 5\' 4"  (1.626 m)   Wt 272 lb (123.4 kg)   BMI 46.69 kg/m   Wt Readings from Last 3 Encounters:  09/21/17 272 lb (123.4 kg)  05/25/17 271 lb 6.4 oz (123.1 kg)  02/24/17 288 lb 6.4 oz (130.8 kg)   Gen: NAD, alert, cooperative with exam, NCAT EYES: EOMI, no conjunctival injection, or no icterus CV: NRRR, normal S1/S2, no murmur, distal pulses 2+ b/l Resp: CTABL, no wheezes, normal WOB Abd: +BS, soft, NTND. no guarding or organomegaly Ext: 1+ pitting edema bilateral anterior shins, warm Neuro: Alert and oriented, strength equal b/l UE and LE, coordination grossly normal  Assessment & Plan:  Toniann FailWendy was seen today for foot swelling.  Diagnoses and all orders for this visit:  Leg swelling Walk 20 minutes in the morning and in the afternoon.  Take Lasix for the next 5 days.  Then take as needed for persistent swelling in feet. Use light compression hose.  -     furosemide (LASIX) 20 MG tablet; Take 1 tablet (20 mg total) by mouth daily.  Type 2 diabetes mellitus without complication, without long-term current use of insulin (HCC) Continue Tradjenta,  samples given today. -     linagliptin (TRADJENTA) 5 MG TABS tablet; Take 1 tablet (5 mg total) by mouth daily. -     Bayer DCA Hb A1c Waived; Future  Essential hypertension Elevated blood pressure.  Increase lisinopril. Return for repeat blood work 2 weeks.   -     lisinopril (PRINIVIL,ZESTRIL) 20 MG tablet; Take 1 tablet (20 mg total) by mouth daily. -     Basic Metabolic Panel; Future   Follow up plan: Return in about 3 months (around 12/22/2017). Rex Krasarol AmeLie Hollars, MD [facility]

## 2017-10-06 ENCOUNTER — Telehealth: Payer: Self-pay | Admitting: Pediatrics

## 2017-10-06 NOTE — Telephone Encounter (Addendum)
Patient states that she has had pain in foot but no redness. No change at this time

## 2017-10-06 NOTE — Telephone Encounter (Signed)
Patient was seen on 09/21/2017 with Dr. Oswaldo DoneVincent for foot swelling.  Lisinopril was increased.  Patient states that swelling has not got any better and is wanting to know if the lisinopril is causing swelling and if so can it be changed.  Patient also states she has not had any trouble with swelling until starting lisinopril

## 2017-10-06 NOTE — Telephone Encounter (Signed)
Lisinopril can cause swelling. It is usually mild, nd about the same in both feet. If there is pain or redness with it, she should be seen soon.

## 2017-10-12 NOTE — Telephone Encounter (Signed)
Returned call.  Swelling, the morning, returns during the day after sitting for long periods of time.  Continue hydrochlorothiazide, Lasix.  Needs to come in for lab work.

## 2018-03-08 ENCOUNTER — Ambulatory Visit (INDEPENDENT_AMBULATORY_CARE_PROVIDER_SITE_OTHER): Payer: 59 | Admitting: *Deleted

## 2018-03-08 DIAGNOSIS — Z23 Encounter for immunization: Secondary | ICD-10-CM

## 2018-03-17 ENCOUNTER — Other Ambulatory Visit: Payer: Self-pay | Admitting: Pediatrics

## 2018-03-17 DIAGNOSIS — I10 Essential (primary) hypertension: Secondary | ICD-10-CM

## 2018-05-11 ENCOUNTER — Other Ambulatory Visit: Payer: Self-pay | Admitting: Pediatrics

## 2018-05-11 DIAGNOSIS — M7989 Other specified soft tissue disorders: Secondary | ICD-10-CM

## 2018-05-27 ENCOUNTER — Other Ambulatory Visit: Payer: Self-pay | Admitting: Pediatrics

## 2018-05-27 DIAGNOSIS — M7989 Other specified soft tissue disorders: Secondary | ICD-10-CM

## 2018-10-19 ENCOUNTER — Other Ambulatory Visit: Payer: Self-pay

## 2018-10-19 ENCOUNTER — Telehealth: Payer: Self-pay | Admitting: General Practice

## 2018-10-19 DIAGNOSIS — Z20822 Contact with and (suspected) exposure to covid-19: Secondary | ICD-10-CM

## 2018-10-19 NOTE — Telephone Encounter (Signed)
Pt aware, no need to do anything if symptom free, stay home as much as possible, if symptoms appear get tested and quarantine

## 2018-10-20 LAB — NOVEL CORONAVIRUS, NAA: SARS-CoV-2, NAA: NOT DETECTED

## 2019-01-15 ENCOUNTER — Other Ambulatory Visit: Payer: Self-pay

## 2019-01-16 ENCOUNTER — Encounter: Payer: Self-pay | Admitting: Family Medicine

## 2019-01-16 ENCOUNTER — Ambulatory Visit (INDEPENDENT_AMBULATORY_CARE_PROVIDER_SITE_OTHER): Payer: 59 | Admitting: Family Medicine

## 2019-01-16 VITALS — BP 138/79 | HR 98 | Temp 97.8°F | Resp 20 | Ht 64.0 in | Wt 284.0 lb

## 2019-01-16 DIAGNOSIS — E1169 Type 2 diabetes mellitus with other specified complication: Secondary | ICD-10-CM

## 2019-01-16 DIAGNOSIS — E1159 Type 2 diabetes mellitus with other circulatory complications: Secondary | ICD-10-CM

## 2019-01-16 DIAGNOSIS — Z0001 Encounter for general adult medical examination with abnormal findings: Secondary | ICD-10-CM | POA: Diagnosis not present

## 2019-01-16 DIAGNOSIS — Z23 Encounter for immunization: Secondary | ICD-10-CM | POA: Diagnosis not present

## 2019-01-16 DIAGNOSIS — M7989 Other specified soft tissue disorders: Secondary | ICD-10-CM

## 2019-01-16 DIAGNOSIS — Z6841 Body Mass Index (BMI) 40.0 and over, adult: Secondary | ICD-10-CM

## 2019-01-16 DIAGNOSIS — E119 Type 2 diabetes mellitus without complications: Secondary | ICD-10-CM

## 2019-01-16 DIAGNOSIS — E559 Vitamin D deficiency, unspecified: Secondary | ICD-10-CM

## 2019-01-16 DIAGNOSIS — I10 Essential (primary) hypertension: Secondary | ICD-10-CM

## 2019-01-16 DIAGNOSIS — E785 Hyperlipidemia, unspecified: Secondary | ICD-10-CM | POA: Insufficient documentation

## 2019-01-16 LAB — BAYER DCA HB A1C WAIVED: HB A1C (BAYER DCA - WAIVED): 11.6 % — ABNORMAL HIGH (ref <7.0)

## 2019-01-16 MED ORDER — LISINOPRIL 10 MG PO TABS: 10.0000 mg | ORAL_TABLET | Freq: Every day | ORAL | 3 refills | Status: DC

## 2019-01-16 MED ORDER — FUROSEMIDE 20 MG PO TABS: 20.0000 mg | ORAL_TABLET | Freq: Two times a day (BID) | ORAL | 3 refills | Status: DC

## 2019-01-16 MED ORDER — METFORMIN HCL 500 MG PO TABS: 500.0000 mg | ORAL_TABLET | Freq: Two times a day (BID) | ORAL | 6 refills | Status: DC

## 2019-01-16 NOTE — Progress Notes (Signed)
Subjective:  Patient ID: [name], female    DOB: Jun 13, 1970, 48 y.o.   MRN: [id number]  Patient Care Team: Baruch Gouty, FNP as PCP - General (Family Medicine)   Chief Complaint:  Annual Exam (No pap)   HPI: [name] is a 48 y.o. female presenting on 01/16/2019 for Annual Exam (No pap)   Pt presents today for her annual physical exam. Pt states she has been doing ok. States she does have lower leg swelling that is worse in the evenings. States she does take the HCTZ as needed for the swelling in her legs. She was told in the past she had diabetes but felt this was not an accurate diagnosis and did not feel it was serious so she has not been taking her medications as prescribed. States she has not taken them in several months. States her blood pressure is usually well controlled with the HCTZ alone. She has not been taking the lisinopril. She does have an IUD and has this replaced last year along with completion of her PAP. She had her mammogram at this time also. She states she follows with GYN on a regular basis. She states she does not exercise on a regular basis but she does try to watch her diet.    Relevant past medical, surgical, family, and social history reviewed and updated as indicated.  Allergies and medications reviewed and updated. Date reviewed: Chart in Epic.   Past Medical History:  Diagnosis Date  . Abnormal Pap smear 2007   colpo  . Complication of anesthesia    Takes awhile for patient to awake.  [name] Cystic fibrosis carrier   . FHx: heart disease   . FHx: hypertension   . FHx: scoliosis   . H/O candidiasis   . H/O chlamydia infection 2006   treated x 1  . Hx: UTI (urinary tract infection)    x 1  . Hyperlipidemia   . Hypertension   . Increased BMI   . Obesity   . Vulvitis 04/27/06    Past Surgical History:  Procedure Laterality Date  . CESAREAN SECTION     x2  . WISDOM TOOTH EXTRACTION      Social History   Socioeconomic History   . Marital status: Single    Spouse name: Not on file  . Number of children: Not on file  . Years of education: Not on file  . Highest education level: Not on file  Occupational History  . Not on file  Social Needs  . Financial resource strain: Not on file  . Food insecurity    Worry: Not on file    Inability: Not on file  . Transportation needs    Medical: Not on file    Non-medical: Not on file  Tobacco Use  . Smoking status: Never Smoker  . Smokeless tobacco: Never Used  Substance and Sexual Activity  . Alcohol use: No  . Drug use: No  . Sexual activity: Yes  Lifestyle  . Physical activity    Days per week: Not on file    Minutes per session: Not on file  . Stress: Not on file  Relationships  . Social Herbalist on phone: Not on file    Gets together: Not on file    Attends religious service: Not on file    Active member of club or organization: Not on file    Attends meetings of clubs or organizations:  Not on file    Relationship status: Not on file  . Intimate partner violence    Fear of current or ex partner: Not on file    Emotionally abused: Not on file    Physically abused: Not on file    Forced sexual activity: Not on file  Other Topics Concern  . Not on file  Social History Narrative  . Not on file    Outpatient Encounter Medications as of 01/16/2019  Medication Sig  . furosemide (LASIX) 20 MG tablet Take 1 tablet (20 mg total) by mouth 2 (two) times daily.  [name] levonorgestrel (MIRENA, 52 MG,) 20 MCG/24HR IUD Mirena 20 mcg/24 hours (5 yrs) 52 mg intrauterine device  Take 1 device by intrauterine route.  . metFORMIN (GLUCOPHAGE) 500 MG tablet Take 1 tablet (500 mg total) by mouth 2 (two) times daily with a meal.  . [DISCONTINUED] furosemide (LASIX) 20 MG tablet Take 1 tablet (20 mg total) by mouth daily.  . [DISCONTINUED] lisinopril (PRINIVIL,ZESTRIL) 20 MG tablet Take 1 tablet (20 mg total) by mouth daily.  . [DISCONTINUED] metFORMIN  (GLUCOPHAGE) 500 MG tablet Take 1 tablet (500 mg total) by mouth 2 (two) times daily with a meal.  . lisinopril (ZESTRIL) 10 MG tablet Take 1 tablet (10 mg total) by mouth daily.  . [DISCONTINUED] hydrochlorothiazide (HYDRODIURIL) 25 MG tablet Take 1 tablet (25 mg total) by mouth daily. (Needs to be seen before next refill) (Patient not taking: Reported on 01/16/2019)  . [DISCONTINUED] linagliptin (TRADJENTA) 5 MG TABS tablet Take 1 tablet (5 mg total) by mouth daily. (Patient not taking: Reported on 01/16/2019)   No facility-administered encounter medications on file as of 01/16/2019.     Allergies  Allergen Reactions  . Percocet [Oxycodone-Acetaminophen]     Review of Systems  Constitutional: Negative for activity change, appetite change, chills, diaphoresis, fatigue, fever and unexpected weight change.  HENT: Negative.   Eyes: Negative.  Negative for photophobia and visual disturbance.  Respiratory: Negative for cough, chest tightness and shortness of breath.   Cardiovascular: Positive for leg swelling. Negative for chest pain and palpitations.  Gastrointestinal: Negative for blood in stool, constipation, diarrhea, nausea and vomiting.  Endocrine: Negative.  Negative for cold intolerance, heat intolerance, polydipsia, polyphagia and polyuria.  Genitourinary: Negative for decreased urine volume, difficulty urinating, dysuria, frequency and urgency.  Musculoskeletal: Negative for arthralgias and myalgias.  Skin: Negative.   Allergic/Immunologic: Negative.   Neurological: Negative for dizziness, tremors, seizures, syncope, facial asymmetry, speech difficulty, weakness, light-headedness, numbness and headaches.  Hematological: Negative.   Psychiatric/Behavioral: Negative for confusion, hallucinations, sleep disturbance and suicidal ideas.  All other systems reviewed and are negative.       Objective:  BP 138/79   Pulse 98   Temp 97.8 F (36.6 C) (Temporal)   Resp 20   Ht _0   (1.626 m)   Wt 284 lb (128.8 kg)   SpO2 98%   BMI 48.75 kg/m    Wt Readings from Last 3 Encounters:  01/16/19 284 lb (128.8 kg)  09/21/17 272 lb (123.4 kg)  05/25/17 271 lb 6.4 oz (123.1 kg)    Physical Exam Vitals signs and nursing note reviewed.  Constitutional:      General: She is not in acute distress.    Appearance: Normal appearance. She is well-developed and well-groomed. She is morbidly obese. She is not ill-appearing, toxic-appearing or diaphoretic.  HENT:     Head: Normocephalic and atraumatic.     Jaw: There is normal  jaw occlusion.     Right Ear: Hearing, tympanic membrane, ear canal and external ear normal.     Left Ear: Hearing, tympanic membrane, ear canal and external ear normal.     Nose: Nose normal.     Mouth/Throat:     Lips: Pink.     Mouth: Mucous membranes are moist.     Pharynx: Oropharynx is clear. Uvula midline.  Eyes:     General: Lids are normal.     Extraocular Movements: Extraocular movements intact.     Conjunctiva/sclera: Conjunctivae normal.     Pupils: Pupils are equal, round, and reactive to light.  Neck:     Musculoskeletal: Normal range of motion and neck supple.     Thyroid: No thyroid mass, thyromegaly or thyroid tenderness.     Vascular: No carotid bruit or JVD.     Trachea: Trachea and phonation normal.  Cardiovascular:     Rate and Rhythm: Normal rate and regular rhythm.     Chest Wall: PMI is not displaced.     Pulses: Normal pulses.     Heart sounds: Normal heart sounds. No murmur. No friction rub. No gallop.   Pulmonary:     Effort: Pulmonary effort is normal. No respiratory distress.     Breath sounds: Normal breath sounds. No wheezing.  Abdominal:     General: Bowel sounds are normal. There is no distension or abdominal bruit.     Palpations: Abdomen is soft. There is no hepatomegaly or splenomegaly.     Tenderness: There is no abdominal tenderness. There is no right CVA tenderness or left CVA tenderness.     Hernia: No  hernia is present.  Musculoskeletal: Normal range of motion.     Right lower leg: 2+ Edema present.     Left lower leg: 2+ Edema present.  Lymphadenopathy:     Cervical: No cervical adenopathy.  Skin:    General: Skin is warm and dry.     Capillary Refill: Capillary refill takes less than 2 seconds.     Coloration: Skin is not cyanotic, jaundiced or pale.     Findings: No rash.  Neurological:     General: No focal deficit present.     Mental Status: She is alert and oriented to person, place, and time.     Cranial Nerves: Cranial nerves are intact. No cranial nerve deficit.     Sensory: Sensation is intact. No sensory deficit.     Motor: Motor function is intact. No weakness.     Coordination: Coordination is intact. Coordination normal.     Gait: Gait is intact. Gait normal.     Deep Tendon Reflexes: Reflexes are normal and symmetric. Reflexes normal.  Psychiatric:        Attention and Perception: Attention and perception normal.        Mood and Affect: Mood and affect normal.        Speech: Speech normal.        Behavior: Behavior normal. Behavior is cooperative.        Thought Content: Thought content normal.        Cognition and Memory: Cognition and memory normal.        Judgment: Judgment normal.     Results for orders placed or performed in visit on 01/16/19  hgba1c  Result Value Ref Range   HB A1C (BAYER DCA - WAIVED) 11.6 (H) <7.0 %       Pertinent labs & imaging results that were available  during my care of the patient were reviewed by me and considered in my medical decision making.  Assessment & Plan:  Bentli was seen today for annual exam.  Diagnoses and all orders for this visit:  Encounter for general adult medical examination with abnormal findings Health maintenance discussed in detail. Diet and exercise encouraged. Labs pending.  -     CMP14+EGFR -     CBC with Differential/Platelet -     Lipid panel -     Thyroid Panel With TSH -     Vitamin D 25  hydroxy -     hgba1c -     Microalbumin / creatinine urine ratio -     Referral to Nutrition and Diabetes Services -     Td : Tetanus/diphtheria >7yo Preservative  free -     Flu Vaccine QUAD 36+ mos IM  Morbid obesity with BMI of 40.0-44.9, adult (HCC) Diet and exercise discussed in length. Labs pending.  -     CMP14+EGFR -     CBC with Differential/Platelet -     Lipid panel -     Thyroid Panel With TSH  Hypertension associated with diabetes ([place]) Importance of medication compliance discussed in detail along with end organ damage associated with uncontrolled HTN. DASH diet and exercise encouraged. Labs pending. Will discontinue HCTZ and start furosemide BID due to increased lower extremity swelling. Lisinopril restarted for BP control and renal protection.  -     lisinopril (ZESTRIL) 10 MG tablet; Take 1 tablet (10 mg total) by mouth daily. -     furosemide (LASIX) 20 MG tablet; Take 1 tablet (20 mg total) by mouth 2 (two) times daily. -     CMP14+EGFR -     CBC with Differential/Platelet -     Thyroid Panel With TSH -     Microalbumin / creatinine urine ratio  Hyperlipidemia associated with type 2 diabetes mellitus ([place]) Diet and exercise encouraged. Discussed the need for statin therapy due to diagnosis of HTN and diabetes. Labs pending. Pt will consider statin therapy.  -     Lipid panel  Leg swelling Symptomatic care discussed. Labs pending. Will initiate furosemide 20 mg twice daily. DASH diet encouraged. Compression hose discussed in detail.  -     furosemide (LASIX) 20 MG tablet; Take 1 tablet (20 mg total) by mouth 2 (two) times daily. -     CMP14+EGFR  Type 2 diabetes mellitus without complication, without long-term current use of insulin (HCC) A1C 11.6 today. Pt willing to restart metformin twice daily and to see diabetic educator. Pt aware second medication needs to be initiated if A1C does not improve at next visit. Pt aware of the significance of blood sugar control  and end organ damage associated with uncontrolled disease.  -     metFORMIN (GLUCOPHAGE) 500 MG tablet; Take 1 tablet (500 mg total) by mouth 2 (two) times daily with a meal. -     hgba1c -     Microalbumin / creatinine urine ratio -     Referral to Nutrition and Diabetes Services  Vitamin D deficiency Labs pending. Continue repletion therapy. If indicated, will change repletion dosage. Eat foods rich in Vit D including milk, orange juice, yogurt with vitamin D added, salmon or mackerel, canned tuna fish, cereals with vitamin D added, and cod liver oil. Get out in the sun but make sure to wear at least SPF 30 sunscreen.  -     Vitamin D 25  hydroxy  Need for immunization against influenza -     Flu Vaccine QUAD 36+ mos IM     Continue all other maintenance medications.  Follow up plan: Return in 3 months (on 04/16/2019), or if symptoms worsen or fail to improve, for DM.  Continue healthy lifestyle choices, including diet (rich in fruits, vegetables, and lean proteins, and low in salt and simple carbohydrates) and exercise (at least 30 minutes of moderate physical activity daily).  Educational handout given for health maintenance  The above assessment and management plan was discussed with the patient. The patient verbalized understanding of and has agreed to the management plan. Patient is aware to call the clinic if they develop any new symptoms or if symptoms persist or worsen. Patient is aware when to return to the clinic for a follow-up visit. Patient educated on when it is appropriate to go to the emergency department.   Monia Pouch, FNP-C [facility] [phone]

## 2019-01-16 NOTE — Patient Instructions (Signed)
Health Maintenance, Female Adopting a healthy lifestyle and getting preventive care are important in promoting health and wellness. Ask your health care provider about:  The right schedule for you to have regular tests and exams.  Things you can do on your own to prevent diseases and keep yourself healthy. What should I know about diet, weight, and exercise? Eat a healthy diet   Eat a diet that includes plenty of vegetables, fruits, low-fat dairy products, and lean protein.  Do not eat a lot of foods that are high in solid fats, added sugars, or sodium. Maintain a healthy weight Body mass index (BMI) is used to identify weight problems. It estimates body fat based on height and weight. Your health care provider can help determine your BMI and help you achieve or maintain a healthy weight. Get regular exercise Get regular exercise. This is one of the most important things you can do for your health. Most adults should:  Exercise for at least 150 minutes each week. The exercise should increase your heart rate and make you sweat (moderate-intensity exercise).  Do strengthening exercises at least twice a week. This is in addition to the moderate-intensity exercise.  Spend less time sitting. Even light physical activity can be beneficial. Watch cholesterol and blood lipids Have your blood tested for lipids and cholesterol at 48 years of age, then have this test every 5 years. Have your cholesterol levels checked more often if:  Your lipid or cholesterol levels are high.  You are older than 48 years of age.  You are at high risk for heart disease. What should I know about cancer screening? Depending on your health history and family history, you may need to have cancer screening at various ages. This may include screening for:  Breast cancer.  Cervical cancer.  Colorectal cancer.  Skin cancer.  Lung cancer. What should I know about heart disease, diabetes, and high blood  pressure? Blood pressure and heart disease  High blood pressure causes heart disease and increases the risk of stroke. This is more likely to develop in people who have high blood pressure readings, are of African descent, or are overweight.  Have your blood pressure checked: ? Every 3-5 years if you are 18-39 years of age. ? Every year if you are 40 years old or older. Diabetes Have regular diabetes screenings. This checks your fasting blood sugar level. Have the screening done:  Once every three years after age 40 if you are at a normal weight and have a low risk for diabetes.  More often and at a younger age if you are overweight or have a high risk for diabetes. What should I know about preventing infection? Hepatitis B If you have a higher risk for hepatitis B, you should be screened for this virus. Talk with your health care provider to find out if you are at risk for hepatitis B infection. Hepatitis C Testing is recommended for:  Everyone born from 1945 through 1965.  Anyone with known risk factors for hepatitis C. Sexually transmitted infections (STIs)  Get screened for STIs, including gonorrhea and chlamydia, if: ? You are sexually active and are younger than 48 years of age. ? You are older than 48 years of age and your health care provider tells you that you are at risk for this type of infection. ? Your sexual activity has changed since you were last screened, and you are at increased risk for chlamydia or gonorrhea. Ask your health care provider if   you are at risk.  Ask your health care provider about whether you are at high risk for HIV. Your health care provider may recommend a prescription medicine to help prevent HIV infection. If you choose to take medicine to prevent HIV, you should first get tested for HIV. You should then be tested every 3 months for as long as you are taking the medicine. Pregnancy  If you are about to stop having your period (premenopausal) and  you may become pregnant, seek counseling before you get pregnant.  Take 400 to 800 micrograms (mcg) of folic acid every day if you become pregnant.  Ask for birth control (contraception) if you want to prevent pregnancy. Osteoporosis and menopause Osteoporosis is a disease in which the bones lose minerals and strength with aging. This can result in bone fractures. If you are 65 years old or older, or if you are at risk for osteoporosis and fractures, ask your health care provider if you should:  Be screened for bone loss.  Take a calcium or vitamin D supplement to lower your risk of fractures.  Be given hormone replacement therapy (HRT) to treat symptoms of menopause. Follow these instructions at home: Lifestyle  Do not use any products that contain nicotine or tobacco, such as cigarettes, e-cigarettes, and chewing tobacco. If you need help quitting, ask your health care provider.  Do not use street drugs.  Do not share needles.  Ask your health care provider for help if you need support or information about quitting drugs. Alcohol use  Do not drink alcohol if: ? Your health care provider tells you not to drink. ? You are pregnant, may be pregnant, or are planning to become pregnant.  If you drink alcohol: ? Limit how much you use to 0-1 drink a day. ? Limit intake if you are breastfeeding.  Be aware of how much alcohol is in your drink. In the [place] one drink equals one 12 oz bottle of beer (355 mL), one 5 oz glass of wine (148 mL), or one 1 oz glass of hard liquor (44 mL). General instructions  Schedule regular health, dental, and eye exams.  Stay current with your vaccines.  Tell your health care provider if: ? You often feel depressed. ? You have ever been abused or do not feel safe at home. Summary  Adopting a healthy lifestyle and getting preventive care are important in promoting health and wellness.  Follow your health care provider's instructions about healthy  diet, exercising, and getting tested or screened for diseases.  Follow your health care provider's instructions on monitoring your cholesterol and blood pressure. This information is not intended to replace advice given to you by your health care provider. Make sure you discuss any questions you have with your health care provider. Document Released: 08/09/2010 Document Revised: 01/17/2018 Document Reviewed: 01/17/2018 Elsevier Patient Education  2020 [employer]  

## 2019-01-17 LAB — LIPID PANEL
Chol/HDL Ratio: 4.5 ratio — ABNORMAL HIGH (ref 0.0–4.4)
Cholesterol, Total: 184 mg/dL (ref 100–199)
HDL: 41 mg/dL (ref >39)
LDL Chol Calc (NIH): 116 mg/dL — ABNORMAL HIGH (ref 0–99)
Triglycerides: 154 mg/dL — ABNORMAL HIGH (ref 0–149)
VLDL Cholesterol Cal: 27 mg/dL (ref 5–40)

## 2019-01-17 LAB — CBC WITH DIFFERENTIAL/PLATELET
Basophils Absolute: 0 10*3/uL (ref 0.0–0.2)
Basos: 0 %
EOS (ABSOLUTE): 0.1 10*3/uL (ref 0.0–0.4)
Eos: 1 %
Hematocrit: 46.9 % — ABNORMAL HIGH (ref 34.0–46.6)
Hemoglobin: 14.4 g/dL (ref 11.1–15.9)
Immature Grans (Abs): 0 10*3/uL (ref 0.0–0.1)
Immature Granulocytes: 0 %
Lymphocytes Absolute: 3.7 10*3/uL — ABNORMAL HIGH (ref 0.7–3.1)
Lymphs: 38 %
MCH: 23.6 pg — ABNORMAL LOW (ref 26.6–33.0)
MCHC: 30.7 g/dL — ABNORMAL LOW (ref 31.5–35.7)
MCV: 77 fL — ABNORMAL LOW (ref 79–97)
Monocytes Absolute: 0.6 10*3/uL (ref 0.1–0.9)
Monocytes: 7 %
Neutrophils Absolute: 5.2 10*3/uL (ref 1.4–7.0)
Neutrophils: 54 %
Platelets: 372 10*3/uL (ref 150–450)
RBC: 6.09 x10E6/uL — ABNORMAL HIGH (ref 3.77–5.28)
RDW: 15.4 % (ref 11.7–15.4)
WBC: 9.6 10*3/uL (ref 3.4–10.8)

## 2019-01-17 LAB — CMP14+EGFR
ALT: 56 IU/L — ABNORMAL HIGH (ref 0–32)
AST: 32 IU/L (ref 0–40)
Albumin/Globulin Ratio: 1.3 (ref 1.2–2.2)
Albumin: 3.9 g/dL (ref 3.8–4.8)
Alkaline Phosphatase: 98 IU/L (ref 39–117)
BUN/Creatinine Ratio: 24 — ABNORMAL HIGH (ref 9–23)
BUN: 17 mg/dL (ref 6–24)
Bilirubin Total: 0.3 mg/dL (ref 0.0–1.2)
CO2: 24 mmol/L (ref 20–29)
Calcium: 9.3 mg/dL (ref 8.7–10.2)
Chloride: 97 mmol/L (ref 96–106)
Creatinine, Ser: 0.72 mg/dL (ref 0.57–1.00)
GFR calc Af Amer: 115 mL/min/{1.73_m2} (ref >59)
GFR calc non Af Amer: 99 mL/min/{1.73_m2} (ref >59)
Globulin, Total: 3 g/dL (ref 1.5–4.5)
Glucose: 255 mg/dL — ABNORMAL HIGH (ref 65–99)
Potassium: 4.7 mmol/L (ref 3.5–5.2)
Sodium: 137 mmol/L (ref 134–144)
Total Protein: 6.9 g/dL (ref 6.0–8.5)

## 2019-01-17 LAB — THYROID PANEL WITH TSH
Free Thyroxine Index: 2.3 (ref 1.2–4.9)
T3 Uptake Ratio: 25 % (ref 24–39)
T4, Total: 9.3 ug/dL (ref 4.5–12.0)
TSH: 2.9 u[IU]/mL (ref 0.450–4.500)

## 2019-01-17 LAB — MICROALBUMIN / CREATININE URINE RATIO
Creatinine, Urine: 268.7 mg/dL
Microalb/Creat Ratio: 69 mg/g creat — ABNORMAL HIGH (ref 0–29)
Microalbumin, Urine: 185.9 ug/mL

## 2019-01-17 LAB — VITAMIN D 25 HYDROXY (VIT D DEFICIENCY, FRACTURES): Vit D, 25-Hydroxy: 22.5 ng/mL — ABNORMAL LOW (ref 30.0–100.0)

## 2019-01-18 ENCOUNTER — Encounter: Payer: Self-pay | Admitting: Family Medicine

## 2019-01-18 MED ORDER — ATORVASTATIN CALCIUM 40 MG PO TABS: 40.0000 mg | ORAL_TABLET | Freq: Every day | ORAL | 1 refills | Status: DC

## 2019-01-18 NOTE — Addendum Note (Signed)
Addended by: Baruch Gouty on: 01/18/2019 01:41 PM   Modules accepted: Orders

## 2019-01-18 NOTE — Progress Notes (Signed)
Urine microalbumin is high at 185, should be below 30. Make sure you are drinking plenty of water and are taking medications as prescribed. We have to get your blood pressure and blood sugar under control to prevent further damage to your kidneys.  Your ALT is elevated, make sure to avoid excessive tylenol and alcohol intake. Will continue to monitor this.  Slight variations in CBC, could be due to inadequate hydration. We will recheck this at next visit.  Thyroid function is normal.  Vit D is low. Need to start repletion therapy. Take 2000 units of Vit D over the counter daily.  Cholesterol is elevated, The 10-year ASCVD risk score is: 13.7%. we need to initiate statin therapy along with ASA 81 mg daily. I will send in medications. Make sure to take statin at night.

## 2019-02-05 ENCOUNTER — Other Ambulatory Visit: Payer: Self-pay | Admitting: Family Medicine

## 2019-02-05 DIAGNOSIS — M7989 Other specified soft tissue disorders: Secondary | ICD-10-CM

## 2019-02-05 DIAGNOSIS — I152 Hypertension secondary to endocrine disorders: Secondary | ICD-10-CM

## 2019-02-05 DIAGNOSIS — E1159 Type 2 diabetes mellitus with other circulatory complications: Secondary | ICD-10-CM

## 2019-02-22 ENCOUNTER — Encounter: Payer: Self-pay | Admitting: Family Medicine

## 2019-02-22 ENCOUNTER — Other Ambulatory Visit: Payer: Self-pay | Admitting: *Deleted

## 2019-02-22 DIAGNOSIS — E1169 Type 2 diabetes mellitus with other specified complication: Secondary | ICD-10-CM

## 2019-02-22 DIAGNOSIS — E785 Hyperlipidemia, unspecified: Secondary | ICD-10-CM

## 2019-02-22 MED ORDER — ATORVASTATIN CALCIUM 40 MG PO TABS: 40.0000 mg | ORAL_TABLET | Freq: Every day | ORAL | 1 refills | Status: DC

## 2019-04-03 ENCOUNTER — Encounter: Payer: Self-pay | Admitting: Family Medicine

## 2019-04-03 ENCOUNTER — Other Ambulatory Visit: Payer: Self-pay | Admitting: Family Medicine

## 2019-04-03 DIAGNOSIS — E1159 Type 2 diabetes mellitus with other circulatory complications: Secondary | ICD-10-CM

## 2019-04-03 MED ORDER — LISINOPRIL 20 MG PO TABS: 20.0000 mg | ORAL_TABLET | Freq: Every day | ORAL | 3 refills | Status: DC

## 2019-04-05 ENCOUNTER — Other Ambulatory Visit: Payer: Self-pay | Admitting: Family Medicine

## 2019-04-05 ENCOUNTER — Encounter: Payer: Self-pay | Admitting: Family Medicine

## 2019-04-05 DIAGNOSIS — E119 Type 2 diabetes mellitus without complications: Secondary | ICD-10-CM

## 2019-04-05 MED ORDER — ACCU-CHEK SOFTCLIX LANCETS MISC: 12 refills | Status: DC

## 2019-04-12 ENCOUNTER — Other Ambulatory Visit: Payer: Self-pay | Admitting: *Deleted

## 2019-04-12 MED ORDER — ONETOUCH VERIO REFLECT W/DEVICE KIT: 1.0000 | PACK | Freq: Three times a day (TID) | 0 refills | Status: DC

## 2019-04-12 MED ORDER — ONETOUCH VERIO VI STRP: ORAL_STRIP | 3 refills | Status: DC

## 2019-04-15 ENCOUNTER — Other Ambulatory Visit: Payer: Self-pay | Admitting: Family Medicine

## 2019-04-15 DIAGNOSIS — E1159 Type 2 diabetes mellitus with other circulatory complications: Secondary | ICD-10-CM

## 2019-04-15 DIAGNOSIS — M7989 Other specified soft tissue disorders: Secondary | ICD-10-CM

## 2019-04-15 DIAGNOSIS — I152 Hypertension secondary to endocrine disorders: Secondary | ICD-10-CM

## 2019-04-17 ENCOUNTER — Ambulatory Visit: Payer: 59 | Admitting: Family Medicine

## 2019-04-24 ENCOUNTER — Encounter: Payer: Self-pay | Admitting: Family Medicine

## 2019-04-30 ENCOUNTER — Encounter: Payer: Self-pay | Admitting: Family Medicine

## 2019-05-01 ENCOUNTER — Encounter: Payer: Self-pay | Admitting: Family Medicine

## 2019-05-01 ENCOUNTER — Other Ambulatory Visit: Payer: Self-pay

## 2019-05-01 ENCOUNTER — Ambulatory Visit (INDEPENDENT_AMBULATORY_CARE_PROVIDER_SITE_OTHER): Payer: 59 | Admitting: Family Medicine

## 2019-05-01 VITALS — BP 118/80 | HR 120 | Temp 98.9°F | Resp 20 | Ht 64.0 in | Wt 277.0 lb

## 2019-05-01 DIAGNOSIS — E1159 Type 2 diabetes mellitus with other circulatory complications: Secondary | ICD-10-CM | POA: Diagnosis not present

## 2019-05-01 DIAGNOSIS — R809 Proteinuria, unspecified: Secondary | ICD-10-CM

## 2019-05-01 DIAGNOSIS — E119 Type 2 diabetes mellitus without complications: Secondary | ICD-10-CM

## 2019-05-01 DIAGNOSIS — E1169 Type 2 diabetes mellitus with other specified complication: Secondary | ICD-10-CM

## 2019-05-01 DIAGNOSIS — E785 Hyperlipidemia, unspecified: Secondary | ICD-10-CM

## 2019-05-01 DIAGNOSIS — R801 Persistent proteinuria, unspecified: Secondary | ICD-10-CM

## 2019-05-01 DIAGNOSIS — I10 Essential (primary) hypertension: Secondary | ICD-10-CM | POA: Diagnosis not present

## 2019-05-01 DIAGNOSIS — I152 Hypertension secondary to endocrine disorders: Secondary | ICD-10-CM

## 2019-05-01 DIAGNOSIS — R009 Unspecified abnormalities of heart beat: Secondary | ICD-10-CM | POA: Diagnosis not present

## 2019-05-01 DIAGNOSIS — D729 Disorder of white blood cells, unspecified: Secondary | ICD-10-CM

## 2019-05-01 DIAGNOSIS — E1129 Type 2 diabetes mellitus with other diabetic kidney complication: Secondary | ICD-10-CM

## 2019-05-01 DIAGNOSIS — R9431 Abnormal electrocardiogram [ECG] [EKG]: Secondary | ICD-10-CM

## 2019-05-01 LAB — URINALYSIS
Bilirubin, UA: NEGATIVE
Glucose, UA: NEGATIVE
Leukocytes,UA: NEGATIVE
Nitrite, UA: NEGATIVE
Specific Gravity, UA: 1.025 (ref 1.005–1.030)
Urobilinogen, Ur: 0.2 mg/dL (ref 0.2–1.0)
pH, UA: 6.5 (ref 5.0–7.5)

## 2019-05-01 LAB — BAYER DCA HB A1C WAIVED: HB A1C (BAYER DCA - WAIVED): 10.3 % — ABNORMAL HIGH (ref <7.0)

## 2019-05-01 MED ORDER — METFORMIN HCL ER 750 MG PO TB24: 750.0000 mg | ORAL_TABLET | Freq: Every day | ORAL | 3 refills | Status: DC

## 2019-05-01 MED ORDER — RYBELSUS 3 MG PO TABS: 3.0000 mg | ORAL_TABLET | Freq: Every day | ORAL | 3 refills | Status: DC

## 2019-05-01 NOTE — Patient Instructions (Signed)

## 2019-05-01 NOTE — Addendum Note (Signed)
Addended by: Sonny Masters on: 05/01/2019 08:29 PM   Modules accepted: Level of Service

## 2019-05-01 NOTE — Progress Notes (Addendum)
Subjective:  Patient ID: [name], female    DOB: 21-Mar-1970, 49 y.o.   MRN: [id number]  Patient Care Team: Baruch Gouty, FNP as PCP - General (Family Medicine)   Chief Complaint:  Hypertension (elevated home readings)   HPI: [name] is a 49 y.o. female presenting on 05/01/2019 for Hypertension (elevated home readings)   1. Type 2 diabetes mellitus without complication, without long-term current use of insulin ([place]) Patient admits to forgetting her evening dose of Metformin occasionally. Has been eating fast foods more than normal. She has lost 7 lbs or more.   2. Hypertension associated with diabetes ([place]) Blood pressure well controlled today. Reports elevated readings at home and once at dental office. Checked her home monitor BP reading compared to office BP reading and there was a significant difference. Home machine registered 135/97 and office machine reading 118/80.   3. Hyperlipidemia associated with type 2 diabetes mellitus ([place]) Patient admits to not adhering to heart healthy diet as of lately. She is taking her statin as prescribed along with ASA 81 mg daily. No adverse side effects.     Relevant past medical, surgical, family, and social history reviewed and updated as indicated.  Allergies and medications reviewed and updated. Date reviewed: Chart in Epic.   Past Medical History:  Diagnosis Date  . Abnormal Pap smear 2007   colpo  . Complication of anesthesia    Takes awhile for patient to awake.  [name] Cystic fibrosis carrier   . FHx: heart disease   . FHx: hypertension   . FHx: scoliosis   . H/O candidiasis   . H/O chlamydia infection 2006   treated x 1  . Hx: UTI (urinary tract infection)    x 1  . Hyperlipidemia   . Hypertension   . Increased BMI   . Obesity   . Vulvitis 04/27/06    Past Surgical History:  Procedure Laterality Date  . CESAREAN SECTION     x2  . WISDOM TOOTH EXTRACTION      Social History   Socioeconomic History   . Marital status: Single    Spouse name: Not on file  . Number of children: Not on file  . Years of education: Not on file  . Highest education level: Not on file  Occupational History  . Not on file  Tobacco Use  . Smoking status: Never Smoker  . Smokeless tobacco: Never Used  Substance and Sexual Activity  . Alcohol use: No  . Drug use: No  . Sexual activity: Yes  Other Topics Concern  . Not on file  Social History Narrative  . Not on file   Social Determinants of Health   Financial Resource Strain:   . Difficulty of Paying Living Expenses:   Food Insecurity:   . Worried About Charity fundraiser in the Last Year:   . Arboriculturist in the Last Year:   Transportation Needs:   . Film/video editor (Medical):   [name] Lack of Transportation (Non-Medical):   Physical Activity:   . Days of Exercise per Week:   . Minutes of Exercise per Session:   Stress:   . Feeling of Stress :   Social Connections:   . Frequency of Communication with Friends and Family:   . Frequency of Social Gatherings with Friends and Family:   . Attends Religious Services:   . Active Member of Clubs or Organizations:   . Attends Club or  Organization Meetings:   [name] Marital Status:   Intimate Partner Violence:   . Fear of Current or Ex-Partner:   . Emotionally Abused:   [name] Physically Abused:   . Sexually Abused:     Outpatient Encounter Medications as of 05/01/2019  Medication Sig  . Accu-Chek Softclix Lancets lancets Use as instructed  . atorvastatin (LIPITOR) 40 MG tablet Take 1 tablet (40 mg total) by mouth daily.  . Blood Glucose Monitoring Suppl (ONETOUCH VERIO REFLECT) w/Device KIT 1 each by Does not apply route 3 (three) times daily. Dx E11.9  . glucose blood (ONETOUCH VERIO) test strip Test BS TID Dx E11.9  . levonorgestrel (MIRENA, 52 MG,) 20 MCG/24HR IUD Mirena 20 mcg/24 hours (5 yrs) 52 mg intrauterine device  Take 1 device by intrauterine route.  [name] lisinopril (ZESTRIL) 20 MG tablet  Take 1 tablet (20 mg total) by mouth daily.  . [DISCONTINUED] furosemide (LASIX) 20 MG tablet TAKE ONE TABLET BY MOUTH TWICE DAILY  . [DISCONTINUED] metFORMIN (GLUCOPHAGE) 500 MG tablet Take 1 tablet (500 mg total) by mouth 2 (two) times daily with a meal.  . metFORMIN (GLUCOPHAGE-XR) 750 MG 24 hr tablet Take 1 tablet (750 mg total) by mouth daily with breakfast.  . Semaglutide (RYBELSUS) 3 MG TABS Take 3 mg by mouth daily.   No facility-administered encounter medications on file as of 05/01/2019.    Allergies  Allergen Reactions  . Percocet [Oxycodone-Acetaminophen]     Review of Systems  Constitutional: Negative for activity change, appetite change, chills, diaphoresis, fatigue, fever and unexpected weight change.  HENT: Negative.   Eyes: Negative.  Negative for photophobia and visual disturbance.  Respiratory: Negative for cough, chest tightness and shortness of breath.   Cardiovascular: Positive for leg swelling. Negative for chest pain and palpitations.       Minimal leg swelling  Gastrointestinal: Negative for abdominal pain, blood in stool, constipation, diarrhea, nausea and vomiting.  Endocrine: Negative.  Negative for cold intolerance, heat intolerance, polydipsia, polyphagia and polyuria.  Genitourinary: Negative for decreased urine volume, difficulty urinating, dysuria, frequency and urgency.  Musculoskeletal: Negative for arthralgias and myalgias.  Skin: Negative.   Allergic/Immunologic: Negative.   Neurological: Negative for dizziness, tremors, seizures, syncope, facial asymmetry, speech difficulty, weakness, light-headedness, numbness and headaches.  Hematological: Negative.   Psychiatric/Behavioral: Negative for confusion, hallucinations, sleep disturbance and suicidal ideas.  All other systems reviewed and are negative.       Objective:  BP 118/80   Pulse (!) 120   Temp 98.9 F (37.2 C)   Resp 20   Ht 5' 4"  (1.626 m)   Wt 277 lb (125.6 kg)   SpO2 98%   BMI  47.55 kg/m    Wt Readings from Last 3 Encounters:  05/01/19 277 lb (125.6 kg)  01/16/19 284 lb (128.8 kg)  09/21/17 272 lb (123.4 kg)    Physical Exam Vitals and nursing note reviewed.  Constitutional:      General: She is not in acute distress.    Appearance: Normal appearance. She is well-developed and well-groomed. She is morbidly obese. She is not ill-appearing, toxic-appearing or diaphoretic.  HENT:     Head: Normocephalic and atraumatic.     Jaw: There is normal jaw occlusion.     Right Ear: Hearing normal.     Left Ear: Hearing normal.     Nose: Nose normal.     Mouth/Throat:     Lips: Pink.     Mouth: Mucous membranes are moist.  Pharynx: Oropharynx is clear. Uvula midline.  Eyes:     General: Lids are normal.     Extraocular Movements: Extraocular movements intact.     Conjunctiva/sclera: Conjunctivae normal.     Pupils: Pupils are equal, round, and reactive to light.  Neck:     Thyroid: No thyroid mass, thyromegaly or thyroid tenderness.     Vascular: No carotid bruit or JVD.     Trachea: Trachea and phonation normal.  Cardiovascular:     Rate and Rhythm: Regular rhythm. Tachycardia present.  No extrasystoles are present.    Chest Wall: PMI is not displaced.     Pulses: Normal pulses.     Heart sounds: Normal heart sounds. No murmur. No friction rub. No gallop.   Pulmonary:     Effort: Pulmonary effort is normal. No respiratory distress.     Breath sounds: Normal breath sounds. No wheezing.  Abdominal:     General: Abdomen is protuberant. Bowel sounds are normal. There is no distension or abdominal bruit.     Palpations: Abdomen is soft. There is no hepatomegaly or splenomegaly.     Tenderness: There is no abdominal tenderness. There is no right CVA tenderness or left CVA tenderness.     Hernia: No hernia is present.  Musculoskeletal:        General: Normal range of motion.     Cervical back: Normal range of motion and neck supple.     Right lower leg: 1+  Edema present.     Left lower leg: 1+ Edema present.  Lymphadenopathy:     Cervical: No cervical adenopathy.  Skin:    General: Skin is warm and dry.     Capillary Refill: Capillary refill takes less than 2 seconds.     Coloration: Skin is not cyanotic, jaundiced or pale.     Findings: No rash.  Neurological:     General: No focal deficit present.     Mental Status: She is alert and oriented to person, place, and time.     Cranial Nerves: Cranial nerves are intact. No cranial nerve deficit.     Sensory: Sensation is intact. No sensory deficit.     Motor: Motor function is intact. No weakness.     Coordination: Coordination is intact. Coordination normal.     Gait: Gait is intact. Gait normal.     Deep Tendon Reflexes: Reflexes are normal and symmetric. Reflexes normal.  Psychiatric:        Attention and Perception: Attention and perception normal.        Mood and Affect: Mood and affect normal.        Speech: Speech normal.        Behavior: Behavior normal. Behavior is cooperative.        Thought Content: Thought content normal.        Cognition and Memory: Cognition and memory normal.        Judgment: Judgment normal.     Results for orders placed or performed in visit on 05/01/19  Urinalysis  Result Value Ref Range   Specific Gravity, UA 1.025 1.005 - 1.030   pH, UA 6.5 5.0 - 7.5   Color, UA Yellow Yellow   Appearance Ur Clear Clear   Leukocytes,UA Negative Negative   Protein,UA 2+ (A) Negative/Trace   Glucose, UA Negative Negative   Ketones, UA Trace (A) Negative   RBC, UA 2+ (A) Negative   Bilirubin, UA Negative Negative   Urobilinogen, Ur 0.2 0.2 - 1.0  mg/dL   Nitrite, UA Negative Negative  Bayer DCA Hb A1c Waived  Result Value Ref Range   HB A1C (BAYER DCA - WAIVED) 10.3 (H) <7.0 %     EKG: ST with BBB. No ectopy or ST-T changes. Monia Pouch, FNP-C  Pertinent labs & imaging results that were available during my care of the patient were reviewed by me and  considered in my medical decision making.  Assessment & Plan:  Alean was seen today for hypertension.  Diagnoses and all orders for this visit:  Type 2 diabetes mellitus without complication, without long-term current use of insulin ([facility]) A1C 10.3 in office today. Will change metformin to 750 mg ER daily due to pt missing evening dose on a regular basis. Will add semaglutide 3 mg daily. Diet and exercise encouraged. Labs pending.        -     Microalbumin / creatinine urine ratio -     Urinalysis -     CBC with Differential/Platelet -     Bayer DCA Hb A1c Waived -     metFORMIN (GLUCOPHAGE-XR) 750 MG 24 hr tablet; Take 1 tablet (750 mg total) by mouth daily with breakfast. -     Semaglutide (RYBELSUS) 3 MG TABS; Take 3 mg by mouth daily. -     Ambulatory referral to Cardiology -     Ambulatory referral to Nephrology  Hypertension associated with diabetes ([place]) BP well controlled. Changes were made in regimen today, stopped Lasix due to altered renal function. Goal BP is 130/80. Pt aware to report any persistent high or low readings. DASH diet and exercise encouraged. Exercise at least 150 minutes per week and increase as tolerated. Goal BMI > 25. Stress management encouraged. Avoid nicotine and tobacco product use. Avoid excessive alcohol and NSAID's. Avoid more than 2000 mg of sodium daily. Medications as prescribed. Follow up as scheduled.  -     EKG 12-Lead -     CMP14+EGFR -     CBC with Differential/Platelet -     Ambulatory referral to Cardiology  Hyperlipidemia associated with type 2 diabetes mellitus ([place]) Diet encouraged - increase intake of fresh fruits and vegetables, increase intake of lean proteins. Bake, broil, or grill foods. Avoid fried, greasy, and fatty foods. Avoid fast foods. Increase intake of fiber-rich whole grains. Exercise encouraged - at least 150 minutes per week and advance as tolerated.  Goal BMI < 25. Continue medications as prescribed. Follow up in 3-6 months  as discussed.  -     CBC with Differential/Platelet -     Lipid panel -     Ambulatory referral to Cardiology  Elevated heart rate with elevated blood pressure and diagnosis of hypertension Abnormal EKG EKG in office: ST with BBB. No ectopy or acute ST-T changes. Will refer to cardiology to establish care.  -     EKG 12-Lead -     CBC with Differential/Platelet -     Ambulatory referral to Cardiology  Persistent proteinuria Ongoing proteinuria. Will refer to nephrology.  -     Ambulatory referral to Nephrology  Morbid obesity ([place]) Diet and exercise encouraged. Labs pending.  -     CMP14+EGFR -     CBC with Differential/Platelet -     Lipid panel -     Bayer DCA Hb A1c Waived     Continue all other maintenance medications.  Follow up plan: Return in about 3 months (around 08/01/2019), or if symptoms worsen or fail to  improve, for DM.   Medical decision-making:  50 minutes spent today reviewing the medical chart, counseling the patient/family, and documenting today's visit.  Continue healthy lifestyle choices, including diet (rich in fruits, vegetables, and lean proteins, and low in salt and simple carbohydrates) and exercise (at least 30 minutes of moderate physical activity daily).  Educational handout given for Rybelsus.  The above assessment and management plan was discussed with the patient. The patient verbalized understanding of and has agreed to the management plan. Patient is aware to call the clinic if they develop any new symptoms or if symptoms persist or worsen. Patient is aware when to return to the clinic for a follow-up visit. Patient educated on when it is appropriate to go to the emergency department.   Robynn Pane, RN, FNP student  I personally was present during the history, physical exam, and medical decision-making activities of this service and have verified that the service and findings are accurately documented in the nurse practitioner student's  note.  Monia Pouch, FNP-C [facility] [address] [place], [place] [address] [phone]

## 2019-05-02 ENCOUNTER — Encounter: Payer: Self-pay | Admitting: Family Medicine

## 2019-05-02 LAB — CBC WITH DIFFERENTIAL/PLATELET
Basophils Absolute: 0 10*3/uL (ref 0.0–0.2)
Basos: 0 %
EOS (ABSOLUTE): 0.1 10*3/uL (ref 0.0–0.4)
Eos: 1 %
Hematocrit: 46.7 % — ABNORMAL HIGH (ref 34.0–46.6)
Hemoglobin: 14.9 g/dL (ref 11.1–15.9)
Immature Grans (Abs): 0 10*3/uL (ref 0.0–0.1)
Immature Granulocytes: 0 %
Lymphocytes Absolute: 4.7 10*3/uL — ABNORMAL HIGH (ref 0.7–3.1)
Lymphs: 33 %
MCH: 24.1 pg — ABNORMAL LOW (ref 26.6–33.0)
MCHC: 31.9 g/dL (ref 31.5–35.7)
MCV: 76 fL — ABNORMAL LOW (ref 79–97)
Monocytes Absolute: 0.8 10*3/uL (ref 0.1–0.9)
Monocytes: 6 %
Neutrophils Absolute: 8.5 10*3/uL — ABNORMAL HIGH (ref 1.4–7.0)
Neutrophils: 60 %
Platelets: 384 10*3/uL (ref 150–450)
RBC: 6.17 x10E6/uL — ABNORMAL HIGH (ref 3.77–5.28)
RDW: 15.8 % — ABNORMAL HIGH (ref 11.7–15.4)
WBC: 14.2 10*3/uL — ABNORMAL HIGH (ref 3.4–10.8)

## 2019-05-02 LAB — LIPID PANEL
Chol/HDL Ratio: 3.7 ratio (ref 0.0–4.4)
Cholesterol, Total: 161 mg/dL (ref 100–199)
HDL: 43 mg/dL (ref >39)
LDL Chol Calc (NIH): 77 mg/dL (ref 0–99)
Triglycerides: 250 mg/dL — ABNORMAL HIGH (ref 0–149)
VLDL Cholesterol Cal: 41 mg/dL — ABNORMAL HIGH (ref 5–40)

## 2019-05-02 LAB — CMP14+EGFR
ALT: 42 IU/L — ABNORMAL HIGH (ref 0–32)
AST: 22 IU/L (ref 0–40)
Albumin/Globulin Ratio: 1.7 (ref 1.2–2.2)
Albumin: 4.6 g/dL (ref 3.8–4.8)
Alkaline Phosphatase: 111 IU/L (ref 39–117)
BUN/Creatinine Ratio: 12 (ref 9–23)
BUN: 15 mg/dL (ref 6–24)
Bilirubin Total: 0.3 mg/dL (ref 0.0–1.2)
CO2: 24 mmol/L (ref 20–29)
Calcium: 10.6 mg/dL — ABNORMAL HIGH (ref 8.7–10.2)
Chloride: 96 mmol/L (ref 96–106)
Creatinine, Ser: 1.3 mg/dL — ABNORMAL HIGH (ref 0.57–1.00)
GFR calc Af Amer: 56 mL/min/{1.73_m2} — ABNORMAL LOW (ref >59)
GFR calc non Af Amer: 48 mL/min/{1.73_m2} — ABNORMAL LOW (ref >59)
Globulin, Total: 2.7 g/dL (ref 1.5–4.5)
Glucose: 195 mg/dL — ABNORMAL HIGH (ref 65–99)
Potassium: 4.1 mmol/L (ref 3.5–5.2)
Sodium: 138 mmol/L (ref 134–144)
Total Protein: 7.3 g/dL (ref 6.0–8.5)

## 2019-05-02 LAB — MICROALBUMIN / CREATININE URINE RATIO
Creatinine, Urine: 241.1 mg/dL
Microalb/Creat Ratio: 117 mg/g creat — ABNORMAL HIGH (ref 0–29)
Microalbumin, Urine: 282.5 ug/mL

## 2019-05-02 NOTE — Addendum Note (Signed)
Addended by: Sonny Masters on: 05/02/2019 01:15 PM   Modules accepted: Orders

## 2019-05-02 NOTE — Progress Notes (Signed)
Persistent urine microalbumin, will place referral to nephrology as discussed.  Glucose elevated but improving. Renal function has declined significantly. Will need to recheck in one week. Drink plenty of water. Do not take lasix. It is important to get blood sugar and blood pressure under control as this is leading to end organ damage as discussed.  ALT remains slightly elevated but is improving. Limit alcohol and tylenol intake.  WBC is elevated, likely from recent dental office visit, will recheck when we recheck renal function.  Triglycerides are elevated at 250. Limit intake of fried, greasy, fatty, and fast foods. Make sure to take medications as prescribed.  Repeat labs in one week.

## 2019-05-06 ENCOUNTER — Encounter: Payer: Self-pay | Admitting: Family Medicine

## 2019-05-08 ENCOUNTER — Other Ambulatory Visit: Payer: Self-pay

## 2019-05-08 ENCOUNTER — Other Ambulatory Visit: Payer: 59

## 2019-05-08 DIAGNOSIS — R801 Persistent proteinuria, unspecified: Secondary | ICD-10-CM

## 2019-05-08 DIAGNOSIS — E1129 Type 2 diabetes mellitus with other diabetic kidney complication: Secondary | ICD-10-CM

## 2019-05-08 DIAGNOSIS — D729 Disorder of white blood cells, unspecified: Secondary | ICD-10-CM

## 2019-05-09 ENCOUNTER — Encounter: Payer: Self-pay | Admitting: Family Medicine

## 2019-05-09 LAB — BMP8+EGFR
BUN/Creatinine Ratio: 12 (ref 9–23)
BUN: 9 mg/dL (ref 6–24)
CO2: 18 mmol/L — ABNORMAL LOW (ref 20–29)
Calcium: 9.3 mg/dL (ref 8.7–10.2)
Chloride: 101 mmol/L (ref 96–106)
Creatinine, Ser: 0.73 mg/dL (ref 0.57–1.00)
GFR calc Af Amer: 112 mL/min/{1.73_m2} (ref >59)
GFR calc non Af Amer: 97 mL/min/{1.73_m2} (ref >59)
Glucose: 127 mg/dL — ABNORMAL HIGH (ref 65–99)
Potassium: 4.2 mmol/L (ref 3.5–5.2)
Sodium: 140 mmol/L (ref 134–144)

## 2019-05-09 LAB — CBC WITH DIFFERENTIAL/PLATELET
Basophils Absolute: 0 10*3/uL (ref 0.0–0.2)
Basos: 0 %
EOS (ABSOLUTE): 0.1 10*3/uL (ref 0.0–0.4)
Eos: 1 %
Hematocrit: 41.1 % (ref 34.0–46.6)
Hemoglobin: 13.2 g/dL (ref 11.1–15.9)
Immature Grans (Abs): 0 10*3/uL (ref 0.0–0.1)
Immature Granulocytes: 0 %
Lymphocytes Absolute: 4.3 10*3/uL — ABNORMAL HIGH (ref 0.7–3.1)
Lymphs: 36 %
MCH: 24.1 pg — ABNORMAL LOW (ref 26.6–33.0)
MCHC: 32.1 g/dL (ref 31.5–35.7)
MCV: 75 fL — ABNORMAL LOW (ref 79–97)
Monocytes Absolute: 0.8 10*3/uL (ref 0.1–0.9)
Monocytes: 6 %
Neutrophils Absolute: 6.8 10*3/uL (ref 1.4–7.0)
Neutrophils: 57 %
Platelets: 337 10*3/uL (ref 150–450)
RBC: 5.48 x10E6/uL — ABNORMAL HIGH (ref 3.77–5.28)
RDW: 14.3 % (ref 11.7–15.4)
WBC: 12 10*3/uL — ABNORMAL HIGH (ref 3.4–10.8)

## 2019-05-28 ENCOUNTER — Encounter: Payer: Self-pay | Admitting: Family Medicine

## 2019-05-28 NOTE — Telephone Encounter (Signed)
Called pt back regarding her ov question, left message for pt to c/b

## 2019-06-12 ENCOUNTER — Ambulatory Visit: Payer: Self-pay | Admitting: Cardiology

## 2019-06-21 ENCOUNTER — Ambulatory Visit: Payer: 59 | Attending: Internal Medicine

## 2019-06-21 DIAGNOSIS — Z23 Encounter for immunization: Secondary | ICD-10-CM

## 2019-06-21 NOTE — Progress Notes (Signed)
   Covid-19 Vaccination Clinic  Name:  HOLLE SPRICK    MRN: [id number] DOB: [birth year]  06/21/2019  Ms. Span was observed post Covid-19 immunization for 15 minutes without incident. She was provided with Vaccine Information Sheet and instruction to access the V-Safe system.   Ms. Kurtenbach was instructed to call 911 with any severe reactions post vaccine: [name] Difficulty breathing  . Swelling of face and throat  . A fast heartbeat  . A bad rash all over body  . Dizziness and weakness   Immunizations Administered    Name Date Dose VIS Date Route   Pfizer COVID-19 Vaccine 06/21/2019  4:18 PM 0.3 mL 04/03/2018 Intramuscular   Manufacturer: [employer], [employer]   Lot: [id number]   NDC: [address]-0

## 2019-07-19 ENCOUNTER — Ambulatory Visit: Payer: 59 | Attending: Internal Medicine

## 2019-07-19 DIAGNOSIS — Z23 Encounter for immunization: Secondary | ICD-10-CM

## 2019-07-19 NOTE — Progress Notes (Signed)
   Covid-19 Vaccination Clinic  Name:  [name]    MRN: [id number] DOB: 02-Jan-1971  07/19/2019  [name] was observed post Covid-19 immunization for 15 minutes without incident. She was provided with Vaccine Information Sheet and instruction to access the V-Safe system.   [name] was instructed to call 911 with any severe reactions post vaccine: [name] Difficulty breathing  . Swelling of face and throat  . A fast heartbeat  . A bad rash all over body  . Dizziness and weakness   Immunizations Administered    Name Date Dose VIS Date Route   Pfizer COVID-19 Vaccine 07/19/2019  3:40 PM 0.3 mL 04/03/2018 Intramuscular   Manufacturer: [employer], [employer]   Lot: [id number]   NDC: [address]-9

## 2019-08-05 ENCOUNTER — Encounter: Payer: Self-pay | Admitting: Family Medicine

## 2019-08-05 ENCOUNTER — Other Ambulatory Visit: Payer: Self-pay

## 2019-08-05 ENCOUNTER — Ambulatory Visit (INDEPENDENT_AMBULATORY_CARE_PROVIDER_SITE_OTHER): Payer: 59 | Admitting: Family Medicine

## 2019-08-05 VITALS — BP 164/100 | HR 100 | Temp 97.7°F | Ht 64.0 in | Wt 270.2 lb

## 2019-08-05 DIAGNOSIS — E785 Hyperlipidemia, unspecified: Secondary | ICD-10-CM

## 2019-08-05 DIAGNOSIS — D72820 Lymphocytosis (symptomatic): Secondary | ICD-10-CM | POA: Diagnosis not present

## 2019-08-05 DIAGNOSIS — E1159 Type 2 diabetes mellitus with other circulatory complications: Secondary | ICD-10-CM

## 2019-08-05 DIAGNOSIS — Z7689 Persons encountering health services in other specified circumstances: Secondary | ICD-10-CM

## 2019-08-05 DIAGNOSIS — I10 Essential (primary) hypertension: Secondary | ICD-10-CM

## 2019-08-05 DIAGNOSIS — E1169 Type 2 diabetes mellitus with other specified complication: Secondary | ICD-10-CM | POA: Diagnosis not present

## 2019-08-05 LAB — BAYER DCA HB A1C WAIVED: HB A1C (BAYER DCA - WAIVED): 7.5 % — ABNORMAL HIGH (ref <7.0)

## 2019-08-05 MED ORDER — METFORMIN HCL ER 500 MG PO TB24: 1000.0000 mg | ORAL_TABLET | Freq: Every day | ORAL | 3 refills | Status: DC

## 2019-08-05 NOTE — Patient Instructions (Signed)
Sugar is looking MUCH better.  A1c down to 7.5.  I have increased the dose of the metformin to 1000mg  every morning (I think that will get you below 7).   This will REPLACE the Metformin 750. Continue Rebelsys  See me in 3 months.

## 2019-08-05 NOTE — Progress Notes (Signed)
Subjective: CC: est care, DM PCP: Mariah Norlander, DO [name] is a 49 y.o. female presenting to clinic today for:  1. Type 2 Diabetes w/ HTN, HLD:  Recent diagnosis, patient here to establish care.  At her last visit with previous PCP A1c was elevated.  She was switched to Metformin ER 750 mg and Rybelsus 3 mg daily was added.  She has been checking the blood sugars and they typically are running below 150.  She is been tolerating both of the medication changes without difficulty.  She had a few episodes of change in GI/bowel habits but they have been tolerable.  She seen a little bit of weight loss which she is happy about.  She is compliant with her lisinopril 20 mg daily as well as her atorvastatin 40 mg daily.  Last eye exam: UTD Last foot exam: needs Last A1c:  Lab Results  Component Value Date   HGBA1C 10.3 (H) 05/01/2019   Nephropathy screen indicated?: on ACE-I Last flu, zoster and/or pneumovax:  PNA vaccine for DM Immunization History  Administered Date(s) Administered  . Influenza,inj,Quad PF,6+ Mos 12/03/2012, 11/07/2013, 04/28/2015, 02/23/2016, 02/24/2017, 03/08/2018, 01/16/2019  . PFIZER SARS-COV-2 Vaccination 06/21/2019, 07/19/2019  . Td 01/16/2019    ROS: No sensation changes in the feet.  No chest pain, shortness of breath, visual disturbance.  She does have lower extremity edema but this is actually much better than it used to be.  She was previously on both HCTZ and Lasix which caused a renal injury.  She admits to having stepped on a piece of glass and wants me to check this out.  Its affecting her right foot.  She has mild tenderness to palpation there.  No exudates, fevers, soft tissue swelling or erythema.    ROS: Per HPI  Allergies  Allergen Reactions  . Percocet [Oxycodone-Acetaminophen]    Past Medical History:  Diagnosis Date  . Abnormal Pap smear 2007   colpo  . Complication of anesthesia    Takes awhile for patient to awake.  [name]  Cystic fibrosis carrier   . FHx: heart disease   . FHx: hypertension   . FHx: scoliosis   . H/O candidiasis   . H/O chlamydia infection 2006   treated x 1  . Hx: UTI (urinary tract infection)    x 1  . Hyperlipidemia   . Hypertension   . Increased BMI   . Obesity   . Vulvitis 04/27/06    Current Outpatient Medications:  .  Accu-Chek Softclix Lancets lancets, Use as instructed, Disp: 100 each, Rfl: 12 .  atorvastatin (LIPITOR) 40 MG tablet, Take 1 tablet (40 mg total) by mouth daily., Disp: 90 tablet, Rfl: 1 .  Blood Glucose Monitoring Suppl (ONETOUCH VERIO REFLECT) w/Device KIT, 1 each by Does not apply route 3 (three) times daily. Dx E11.9, Disp: 1 kit, Rfl: 0 .  glucose blood (ONETOUCH VERIO) test strip, Test BS TID Dx E11.9, Disp: 300 each, Rfl: 3 .  levonorgestrel (MIRENA, 52 MG,) 20 MCG/24HR IUD, Mirena 20 mcg/24 hours (5 yrs) 52 mg intrauterine device  Take 1 device by intrauterine route., Disp: , Rfl:  .  lisinopril (ZESTRIL) 20 MG tablet, Take 1 tablet (20 mg total) by mouth daily., Disp: 90 tablet, Rfl: 3 .  Semaglutide (RYBELSUS) 3 MG TABS, Take 3 mg by mouth daily., Disp: 90 tablet, Rfl: 3 .  metFORMIN (GLUCOPHAGE-XR) 750 MG 24 hr tablet, Take 1 tablet (750 mg total) by mouth daily with  breakfast., Disp: 90 tablet, Rfl: 3 Social History   Socioeconomic History  . Marital status: Single    Spouse name: Not on file  . Number of children: Not on file  . Years of education: Not on file  . Highest education level: Not on file  Occupational History  . Not on file  Tobacco Use  . Smoking status: Never Smoker  . Smokeless tobacco: Never Used  Substance and Sexual Activity  . Alcohol use: No  . Drug use: No  . Sexual activity: Yes  Other Topics Concern  . Not on file  Social History Narrative  . Not on file   Social Determinants of Health   Financial Resource Strain:   . Difficulty of Paying Living Expenses:   Food Insecurity:   . Worried About Charity fundraiser  in the Last Year:   . Arboriculturist in the Last Year:   Transportation Needs:   . Film/video editor (Medical):   [name] Lack of Transportation (Non-Medical):   Physical Activity:   . Days of Exercise per Week:   . Minutes of Exercise per Session:   Stress:   . Feeling of Stress :   Social Connections:   . Frequency of Communication with Friends and Family:   . Frequency of Social Gatherings with Friends and Family:   . Attends Religious Services:   . Active Member of Clubs or Organizations:   . Attends Archivist Meetings:   [name] Marital Status:   Intimate Partner Violence:   . Fear of Current or Ex-Partner:   . Emotionally Abused:   [name] Physically Abused:   . Sexually Abused:    Family History  Problem Relation Age of Onset  . Heart disease Mother   . Hypertension Mother   . Diabetes Mother   . Hypertension Sister   . Alcohol abuse Maternal Uncle   . Mental illness Paternal Grandmother        alzheimer's  . Diabetes Paternal Grandmother     Objective: Office vital signs reviewed. BP (!) 164/100   Pulse 100   Temp 97.7 F (36.5 C)   Ht 5' 4"  (1.626 m)   Wt 270 lb 3.2 oz (122.6 kg)   SpO2 98%   BMI 46.38 kg/m   Physical Examination:  General: Awake, alert, well nourished, obese. No acute distress HEENT: Normal; sclera white.  Moist mucous membranes Cardio: regular rate and rhythm, S1S2 heard, no murmurs appreciated Pulm: clear to auscultation bilaterally, no wheezes, rhonchi or rales; normal work of breathing on room air Extremities: warm, well perfused, trace - +1 pitting edema to ankles, No cyanosis or clubbing; +2 pulses bilaterally MSK: normal gait and station Skin: dry; intact; postinflammatory hyperpigmentation venous stasis changes noted the lower extremities Neuro: See diabetic foot exam Diabetic Foot Exam - Simple   Simple Foot Form Diabetic Foot exam was performed with the following findings: Yes 08/05/2019  5:06 PM  Visual Inspection No  deformities, no ulcerations, no other skin breakdown bilaterally: Yes Sensation Testing Intact to touch and monofilament testing bilaterally: Yes Pulse Check Posterior Tibialis and Dorsalis pulse intact bilaterally: Yes Comments She does have a small lesion on the plantar surface of the right foot.  She recently stepped on a piece of glass.  Unsure if this is totally gone out of her foot.  Reports some mild tenderness but no other concerning symptoms or signs    Assessment/ Plan: 49 y.o. female   1. Type 2  diabetes mellitus with other specified complication, without long-term current use of insulin (HCC) A1c under much better control than previous.  She is still technically uncontrolled diabetes with A1c at 7.5 but I think she is doing an excellent job thus far.  Continue Rybelsus.  Have increased her Metformin to 1000 mg daily.  She is aware of change.  Her diabetic foot exam was performed today.  We will get her recent eye exam from her ophthalmologist.  I will see her back in about 3 months, sooner if needed - Bayer DCA Hb A1c Waived - metFORMIN (GLUCOPHAGE XR) 500 MG 24 hr tablet; Take 2 tablets (1,000 mg total) by mouth daily with breakfast.  Dispense: 180 tablet; Refill: 3  2. Hypertension associated with diabetes ([place]) Not at goal.  She is compliant with medications.  I would like her to come back in about 2 weeks to recheck blood pressures with nurse.  If persistently elevated, we will plan to increase her lisinopril to 40 mg daily with repeat BMP and recheck blood pressure 1-2 weeks following dose change  3. Hyperlipidemia associated with type 2 diabetes mellitus ([place]) Continue statin  4. Establishing care with new doctor, encounter for  5. Lymphocytosis Uncertain etiology.  She had 2 consecutive white blood cell elevations.  For this reason I am repeating CBC with differential, adding pathologist smear review and LDH.  We will plan for referral pending these results - CBC with  Differential - Lactate Dehydrogenase - Pathologist smear review   Orders Placed This Encounter  Procedures  . Bayer DCA Hb A1c Waived  . CBC with Differential  . Lactate Dehydrogenase  . Pathologist smear review   No orders of the defined types were placed in this encounter.    Mariah Norlander, DO [place] [phone]

## 2019-08-06 LAB — CBC WITH DIFFERENTIAL/PLATELET
Basophils Absolute: 0.1 10*3/uL (ref 0.0–0.2)
Basos: 0 %
EOS (ABSOLUTE): 0.1 10*3/uL (ref 0.0–0.4)
Eos: 1 %
Hematocrit: 40.7 % (ref 34.0–46.6)
Hemoglobin: 13 g/dL (ref 11.1–15.9)
Immature Grans (Abs): 0.1 10*3/uL (ref 0.0–0.1)
Immature Granulocytes: 1 %
Lymphocytes Absolute: 4.7 10*3/uL — ABNORMAL HIGH (ref 0.7–3.1)
Lymphs: 31 %
MCH: 24.3 pg — ABNORMAL LOW (ref 26.6–33.0)
MCHC: 31.9 g/dL (ref 31.5–35.7)
MCV: 76 fL — ABNORMAL LOW (ref 79–97)
Monocytes Absolute: 0.9 10*3/uL (ref 0.1–0.9)
Monocytes: 6 %
Neutrophils Absolute: 9.2 10*3/uL — ABNORMAL HIGH (ref 1.4–7.0)
Neutrophils: 61 %
Platelets: 369 10*3/uL (ref 150–450)
RBC: 5.35 x10E6/uL — ABNORMAL HIGH (ref 3.77–5.28)
RDW: 14.8 % (ref 11.7–15.4)
WBC: 15.1 10*3/uL — ABNORMAL HIGH (ref 3.4–10.8)

## 2019-08-06 LAB — LACTATE DEHYDROGENASE: LDH: 154 IU/L (ref 119–226)

## 2019-08-11 LAB — PATHOLOGIST SMEAR REVIEW
Basophils Absolute: 0.1 10*3/uL (ref 0.0–0.2)
Basos: 0 %
EOS (ABSOLUTE): 0.1 10*3/uL (ref 0.0–0.4)
Eos: 1 %
Hematocrit: 41.1 % (ref 34.0–46.6)
Hemoglobin: 12.9 g/dL (ref 11.1–15.9)
Immature Grans (Abs): 0.1 10*3/uL (ref 0.0–0.1)
Immature Granulocytes: 1 %
Lymphocytes Absolute: 4.5 10*3/uL — ABNORMAL HIGH (ref 0.7–3.1)
Lymphs: 30 %
MCH: 23.8 pg — ABNORMAL LOW (ref 26.6–33.0)
MCHC: 31.4 g/dL — ABNORMAL LOW (ref 31.5–35.7)
MCV: 76 fL — ABNORMAL LOW (ref 79–97)
Monocytes Absolute: 0.9 10*3/uL (ref 0.1–0.9)
Monocytes: 6 %
Neutrophils Absolute: 9.5 10*3/uL — ABNORMAL HIGH (ref 1.4–7.0)
Neutrophils: 62 %
Path Rev PLTs: NORMAL
Platelets: 382 10*3/uL (ref 150–450)
RBC: 5.41 x10E6/uL — ABNORMAL HIGH (ref 3.77–5.28)
RDW: 14.8 % (ref 11.7–15.4)
WBC: 15.2 10*3/uL — ABNORMAL HIGH (ref 3.4–10.8)

## 2019-08-14 ENCOUNTER — Other Ambulatory Visit: Payer: Self-pay | Admitting: Family Medicine

## 2019-08-14 DIAGNOSIS — D72829 Elevated white blood cell count, unspecified: Secondary | ICD-10-CM

## 2019-08-16 ENCOUNTER — Encounter: Payer: Self-pay | Admitting: Family Medicine

## 2019-08-21 ENCOUNTER — Telehealth: Payer: Self-pay | Admitting: Adult Health

## 2019-08-21 NOTE — Telephone Encounter (Signed)
Received a new hem referral from dr. Nadine Counts at [facility] for leukocytosis. [name] has been cld and scheduled to see Mardella Layman on 7/27 at 1130am w/labs at 11am. Pt aware to arrive 15 minutes early.

## 2019-09-01 NOTE — Progress Notes (Addendum)
[place]  [phone] [phone]     ID: [name] DOB: September 24, 1970  MR#: [id number]  IPJ#:[id number]  Patient Care Team: Mariah Norlander, DO as PCP - General (Family Medicine) Mariah Dock, NP OTHER MD:  CHIEF COMPLAINT: leukocytosis   HISTORY OF CURRENT ILLNESS:  [name] is a 49 year old woman who is referred by Dr. Lajuana Blair for evaluation of her persistent leukocytosis.  This leukocytosis was initially noted on lab work beginning in March, 2021.  See her WBC trend below:   Results for [name] (MRN [id number]) as of 09/01/2019 09:29  Ref. Range 01/16/2019 11:57 05/01/2019 16:16 05/08/2019 13:46 08/05/2019 16:26 08/05/2019 16:26  WBC Latest Ref Range: 3.4 - 10.8 x10E3/uL 9.6 14.2 (H) 12.0 (H) 15.1 (H) 15.2 (H)   [name] has lost about 14 pounds since June of 2020.  She denies any change in the winter of 2020-2021 that she can identify, such as a new ache, pain, or symptom, other than some pelvic pressure that was initially noticed with intercourse during climax, that resolved, and didn't happen since that time.    The patient's subsequent history is as detailed below.  INTERVAL HISTORY:  [name] notes that she is feeling quite well today other than the nerves she is having from being here at the cancer center and being concerned that something more serious may be going on.  She denies any new issues, such as fatigue, night sweats, unintentional weight loss, lymphadenopathy, fevers, chills, aches/pains.    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  She is working on losing weight and denies any tobacco use.  She has no signs of infection such as fever, skin change or abnormality, cough, shortness of breath, chest pain, palpitations, bowel/bladder changes, headaches, nausea, or vomiting.  A detailed ROS was otherwise non  contributory.    PAST MEDICAL HISTORY: Past Medical History:  Diagnosis Date  . Abnormal Pap smear 2007   colpo  . Complication of anesthesia    Takes awhile for patient to awake.  [name] Cystic fibrosis carrier   . FHx: heart disease   . FHx: hypertension   . FHx: scoliosis   . H/O candidiasis   . H/O chlamydia infection 2006   treated x 1  . Hx: UTI (urinary tract infection)    x 1  . Hyperlipidemia   . Hypertension   . Increased BMI   . Obesity   . Vulvitis 04/27/06    PAST SURGICAL HISTORY: Past Surgical History:  Procedure Laterality Date  . CESAREAN SECTION     x2  . WISDOM TOOTH EXTRACTION      FAMILY HISTORY Family History  Problem Relation Age of Onset  . Heart disease Mother   . Hypertension Mother   . Diabetes Mother   . Hypertension Sister   . Alcohol abuse Maternal Uncle   . Mental illness Paternal Grandmother        alzheimer's  . Diabetes Paternal Grandmother     GYNECOLOGIC HISTORY:  No LMP recorded. (Menstrual status: IUD). Menarche: 49 years old Age at first live birth: 49 years old GX P 2 LMP 12 years ago Contraceptive IUD in place, was on OCP x 16 years previously HRT none  Hysterectomy? no Salpingo-oophorectomy?no    SOCIAL HISTORY: single, lives with her two sons, ages 61 and 41 who are  going to local school.  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  She does not smoke, drink ETOH regularly, or use drugs.     ADVANCED DIRECTIVES: Not in place   HEALTH MAINTENANCE: Social History   Tobacco Use  . Smoking status: Never Smoker  . Smokeless tobacco: Never Used  Substance Use Topics  . Alcohol use: No  . Drug use: No     Colonoscopy: to be scheduled  PAP: 2021  Mammo: 2021   Allergies  Allergen Reactions  . Percocet [Oxycodone-Acetaminophen]     Current Outpatient Medications  Medication Sig Dispense Refill  . Accu-Chek Softclix Lancets lancets Use as instructed 100 each 12  .  atorvastatin (LIPITOR) 40 MG tablet Take 1 tablet (40 mg total) by mouth daily. 90 tablet 1  . Blood Glucose Monitoring Suppl (ONETOUCH VERIO REFLECT) w/Device KIT 1 each by Does not apply route 3 (three) times daily. Dx E11.9 1 kit 0  . cholecalciferol (VITAMIN D3) 25 MCG (1000 UNIT) tablet Take 2,000 Units by mouth daily.    [name] glucose blood (ONETOUCH VERIO) test strip Test BS TID Dx E11.9 300 each 3  . levonorgestrel (MIRENA, 52 MG,) 20 MCG/24HR IUD Mirena 20 mcg/24 hours (5 yrs) 52 mg intrauterine device  Take 1 device by intrauterine route.    [name] lisinopril (ZESTRIL) 20 MG tablet Take 1 tablet (20 mg total) by mouth daily. 90 tablet 3  . metFORMIN (GLUCOPHAGE XR) 500 MG 24 hr tablet Take 2 tablets (1,000 mg total) by mouth daily with breakfast. 180 tablet 3  . Semaglutide (RYBELSUS) 3 MG TABS Take 3 mg by mouth daily. 90 tablet 3   No current facility-administered medications for this visit.    OBJECTIVE:  Vitals:   09/03/19 1147  BP: (!) 149/90  Pulse: 103  Resp: 18  Temp: 98.9 F (37.2 C)  SpO2: 100%     Body mass index is 47.13 kg/m.   Wt Readings from Last 3 Encounters:  09/03/19 (!) 274 lb 9.6 oz (124.6 kg)  08/05/19 270 lb 3.2 oz (122.6 kg)  05/01/19 277 lb (125.6 kg)      ECOG FS:0 - Asymptomatic  GENERAL: Patient is a well appearing female in no acute distress HEENT:  Sclerae anicteric.  Mask in place. Neck is supple.  NODES:  No cervical, supraclavicular, or axillary lymphadenopathy palpated.  BREAST EXAM:  Deferred. LUNGS:  Clear to auscultation bilaterally.  No wheezes or rhonchi. HEART:  Regular rate and rhythm. No murmur appreciated. ABDOMEN:  Soft, nontender.  Positive, normoactive bowel sounds. No organomegaly palpated. MSK:  No focal spinal tenderness to palpation.  EXTREMITIES:  No peripheral edema.   SKIN:  Clear with no obvious rashes or skin changes. No nail dyscrasia. NEURO:  Nonfocal. Well oriented.  Appropriate affect.    LAB RESULTS:  CMP      Component Value Date/Time   NA 138 09/03/2019 1124   NA 140 05/08/2019 1346   K 3.7 09/03/2019 1124   CL 102 09/03/2019 1124   CO2 25 09/03/2019 1124   GLUCOSE 215 (H) 09/03/2019 1124   BUN 13 09/03/2019 1124   BUN 9 05/08/2019 1346   CREATININE 0.94 09/03/2019 1124   CALCIUM 9.8 09/03/2019 1124   PROT 7.4 09/03/2019 1124   PROT 7.3 05/01/2019 1616   ALBUMIN 3.6 09/03/2019 1124   ALBUMIN 4.6 05/01/2019 1616   AST 24 09/03/2019 1124   ALT 37 09/03/2019 1124  ALKPHOS 93 09/03/2019 1124   BILITOT 0.6 09/03/2019 1124   GFRNONAA >60 09/03/2019 1124   GFRAA >60 09/03/2019 1124    No results found for: TOTALPROTELP, ALBUMINELP, A1GS, A2GS, BETS, BETA2SER, GAMS, MSPIKE, SPEI  No results found for: Nils Pyle, [facility]  Lab Results  Component Value Date   WBC 11.2 (H) 09/03/2019   NEUTROABS 7.3 09/03/2019   HGB 12.1 09/03/2019   HCT 38.0 09/03/2019   MCV 76.0 (L) 09/03/2019   PLT 336 09/03/2019      Chemistry      Component Value Date/Time   NA 138 09/03/2019 1124   NA 140 05/08/2019 1346   K 3.7 09/03/2019 1124   CL 102 09/03/2019 1124   CO2 25 09/03/2019 1124   BUN 13 09/03/2019 1124   BUN 9 05/08/2019 1346   CREATININE 0.94 09/03/2019 1124      Component Value Date/Time   CALCIUM 9.8 09/03/2019 1124   ALKPHOS 93 09/03/2019 1124   AST 24 09/03/2019 1124   ALT 37 09/03/2019 1124   BILITOT 0.6 09/03/2019 1124       No results found for: LABCA2  No components found for: ZOXWRU045  No results for input(s): INR in the last 168 hours.  No results found for: LABCA2  No results found for: WUJ811  No results found for: BJY782  No results found for: NFA213  No results found for: CA2729  No components found for: HGQUANT  No results found for: CEA1 / No results found for: CEA1   No results found for: AFPTUMOR  No results found for: CHROMOGRNA  No results found for: PSA1  Appointment on 09/03/2019  Component Date Value Ref Range  Status  . Sodium 09/03/2019 138  135 - 145 mmol/L Final  . Potassium 09/03/2019 3.7  3.5 - 5.1 mmol/L Final  . Chloride 09/03/2019 102  98 - 111 mmol/L Final  . CO2 09/03/2019 25  22 - 32 mmol/L Final  . Glucose, Bld 09/03/2019 215* 70 - 99 mg/dL Final   Glucose reference range applies only to samples taken after fasting for at least 8 hours.  . BUN 09/03/2019 13  6 - 20 mg/dL Final  . Creatinine 09/03/2019 0.94  0.44 - 1.00 mg/dL Final  . Calcium 09/03/2019 9.8  8.9 - 10.3 mg/dL Final  . Total Protein 09/03/2019 7.4  6.5 - 8.1 g/dL Final  . Albumin 09/03/2019 3.6  3.5 - 5.0 g/dL Final  . AST 09/03/2019 24  15 - 41 U/L Final  . ALT 09/03/2019 37  0 - 44 U/L Final  . Alkaline Phosphatase 09/03/2019 93  38 - 126 U/L Final  . Total Bilirubin 09/03/2019 0.6  0.3 - 1.2 mg/dL Final  . GFR, Est Non Af Am 09/03/2019 >60  >60 mL/min Final  . GFR, Est AFR Am 09/03/2019 >60  >60 mL/min Final  . Anion gap 09/03/2019 11  5 - 15 Final   Performed at [facility], [place] [address], [place], [place] [address]  . Smear Review 09/03/2019 SMEAR STAINED AND AVAILABLE FOR REVIEW   Final   Performed at [facility], [address], [place], [place] [address]  . Retic Ct Pct 09/03/2019 1.6  0.4 - 3.1 % Final  . RBC. 09/03/2019 5.10  3.87 - 5.11 MIL/uL Final  . Retic Count, Absolute 09/03/2019 79.6  19.0 - 186.0 K/uL Final  . Immature Retic Fract 09/03/2019 16.9* 2.3 - 15.9 % Final   Performed  at [facility], [place] [address], [place], [place] [address]  . WBC Count 09/03/2019 11.2* 4.0 - 10.5 K/uL Final  . RBC 09/03/2019 5.00  3.87 - 5.11 MIL/uL Final  . Hemoglobin 09/03/2019 12.1  12.0 - 15.0 g/dL Final  . HCT 09/03/2019 38.0  36 - 46 % Final  . MCV 09/03/2019 76.0* 80.0 - 100.0 fL Final  . MCH 09/03/2019 24.2* 26.0 - 34.0 pg Final  . MCHC 09/03/2019 31.8  30.0 - 36.0 g/dL Final  . RDW 09/03/2019 14.8  11.5 - 15.5 % Final  .  Platelet Count 09/03/2019 336  150 - 400 K/uL Final  . nRBC 09/03/2019 0.0  0.0 - 0.2 % Final  . Neutrophils Relative % 09/03/2019 66  % Final  . Neutro Abs 09/03/2019 7.3  1.7 - 7.7 K/uL Final  . Lymphocytes Relative 09/03/2019 27  % Final  . Lymphs Abs 09/03/2019 3.1  0.7 - 4.0 K/uL Final  . Monocytes Relative 09/03/2019 6  % Final  . Monocytes Absolute 09/03/2019 0.7  0 - 1 K/uL Final  . Eosinophils Relative 09/03/2019 1  % Final  . Eosinophils Absolute 09/03/2019 0.1  0 - 0 K/uL Final  . Basophils Relative 09/03/2019 0  % Final  . Basophils Absolute 09/03/2019 0.0  0 - 0 K/uL Final  . Immature Granulocytes 09/03/2019 0  % Final  . Abs Immature Granulocytes 09/03/2019 0.03  0.00 - 0.07 K/uL Final   Performed at [facility], [place] [address], [place], [place] [address]    (this displays the last labs from the last 3 days)  No results found for: TOTALPROTELP, ALBUMINELP, A1GS, A2GS, BETS, BETA2SER, GAMS, MSPIKE, SPEI (this displays SPEP labs)  No results found for: KPAFRELGTCHN, LAMBDASER, KAPLAMBRATIO (kappa/lambda light chains)  No results found for: HGBA, HGBA2QUANT, HGBFQUANT, HGBSQUAN (Hemoglobinopathy evaluation)   Lab Results  Component Value Date   LDH 154 08/05/2019    No results found for: IRON, TIBC, IRONPCTSAT (Iron and TIBC)  No results found for: FERRITIN  Urinalysis    Component Value Date/Time   APPEARANCEUR Clear 05/01/2019 1621   PHURINE 4.5 07/15/2011 0957   GLUCOSEU Negative 05/01/2019 1621   BILIRUBINUR Negative 05/01/2019 1621   PROTEINUR 2+ (A) 05/01/2019 1621   NITRITE Negative 05/01/2019 1621   LEUKOCYTESUR Negative 05/01/2019 1621     STUDIES: No results found.    ASSESSMENT: 49 y.o. [place], [place] woman with leukocytosis first noted in 04/2019.  1.  Unsure of etiology; full work up pending  (a) will follow CBC with diff every 3 months to evaluate trends  PLAN: [name] met with myself and Dr. Jana Hakim today  after reviewing her blood film underneath the microscope.  We explained that she has three types of blood cells made in her bone marrow which include: 1. Red blood cells: these are oxygen carrying cells.  [name] are normal in number and small in appearance.  The small size is a common phenomenon called thalessemia which is seen mainly in patients who have African or Mediterranean descent.  This is a benign finding. 2. Platelets: these are clotting cells, these cells are normal in number and appearance. 3. WBCs: These are immune cells.  These are increased in number, but normal in appearance, and there is nothing in her blood film that suggests an acute leukemia.    A full panel of inflammatory markers and infectious work up is pending.  Dr. Jana Hakim reviewed that at this time, the  etiology of the leukocytosis is unclear, however it is benign.  His suggestion is to follow the numbers every 3 months for one year, and he will see her in one year for a my chart video visit to review and discuss plans.  Of course, should there be any increasingly obvious abnormality, or should she develop symptoms, we will see her sooner.    [name] notes that she is ok with this plan.  She will have her labs drawn with her PCP in [place], they will fax to [place] at [phone], and we will see her back in one years time.    She knows to call for any questions that may arise between now and her next appointment.  We are happy to see her sooner if needed.   Total encounter time: 60 minutes*   Wilber Bihari, NP 09/03/19 12:11 PM Medical Oncology and Hematology [facility] [place], [place] [address] Tel. [phone]    Fax. [phone]   ADDENDUM: 49 year old [place] woman with a mild but persistent leukocytosis present since March 2021.  Review of the peripheral blood film shows no left shift, no dysplasia, and is otherwise entirely nondiagnostic.  Since the above note  was written we also have established a negative ANA, negative HIV, and negative hepatitis B and C studies.  The ESR is mildly elevated at 37 and a CRP mildly elevated at 1.2.  Patients with morbid obesity like Ms. Lasota sometimes develop a mildly elevated white cell count because the increased fat tissue is associated with inflammation and with increased estrogen production, both of which can increase the white cell count.  This would be a very good explanation in this case except the patient does not appear to have had this problem before March 2021 (she did have a leukocytosis during pregnancy in 2009, and that is physiologic).  I reassured Nasirah that she does not have any leukemia or other evidence of cancer.  He has a mild inflammation most likely causing her leukocytosis but I do not know the specific cause since her review of systems was otherwise noncontributory and in particular there does not appear to be any evidence of inflammatory arthritis or infection.  I think at this point all we need to do is follow-up and we will obtain labs every 3 months for the next year and then reassess.  If the problem resolves as it may well do we will discontinue follow-up.  Else does have a normal hemoglobin of 12.1, with a low MCV at 76.  Review of her prior MCVs finds that all of them are under 80.  This is not due to iron deficiency since we obtained a ferritin which was 357 and her iron saturation is normal.  I let Nena know that she most likely has thalassemia trait, but this is of no consequence in her situation, that it is heritable so that each one of her children has a 50% chance of having the same treatment and she can ascertain that by looking at their CBCs if they ever obtain 1 for any reason.  The main reason to be aware of the diagnosis of thalassemia trait is that frequently a low MCV is automatically interpreted as iron deficiency and that is not the case here.  The patient is not iron  deficient and does not need iron replacement  I personally saw this patient and performed a substantive portion of this encounter with the listed APP documented above.  Chauncey Cruel, MD Medical Oncology and Hematology [facility] [address] [place], [place] [address] Tel. [phone]    Fax. [phone]  *Total Encounter Time as defined by the Centers for Medicare and Medicaid Services includes, in addition to the face-to-face time of a patient visit (documented in the note above) non-face-to-face time: obtaining and reviewing outside history, ordering and reviewing medications, tests or procedures, care coordination (communications with other health care professionals or caregivers) and documentation in the medical record.

## 2019-09-03 ENCOUNTER — Encounter: Payer: Self-pay | Admitting: Adult Health

## 2019-09-03 ENCOUNTER — Other Ambulatory Visit: Payer: Self-pay

## 2019-09-03 ENCOUNTER — Inpatient Hospital Stay: Payer: 59

## 2019-09-03 ENCOUNTER — Other Ambulatory Visit: Payer: Self-pay | Admitting: Oncology

## 2019-09-03 ENCOUNTER — Inpatient Hospital Stay: Payer: 59 | Attending: Adult Health | Admitting: Adult Health

## 2019-09-03 VITALS — BP 149/90 | HR 103 | Temp 98.9°F | Resp 18 | Ht 64.0 in | Wt 274.6 lb

## 2019-09-03 DIAGNOSIS — D72829 Elevated white blood cell count, unspecified: Secondary | ICD-10-CM | POA: Insufficient documentation

## 2019-09-03 LAB — CMP (CANCER CENTER ONLY)
ALT: 37 U/L (ref 0–44)
AST: 24 U/L (ref 15–41)
Albumin: 3.6 g/dL (ref 3.5–5.0)
Alkaline Phosphatase: 93 U/L (ref 38–126)
Anion gap: 11 (ref 5–15)
BUN: 13 mg/dL (ref 6–20)
CO2: 25 mmol/L (ref 22–32)
Calcium: 9.8 mg/dL (ref 8.9–10.3)
Chloride: 102 mmol/L (ref 98–111)
Creatinine: 0.94 mg/dL (ref 0.44–1.00)
GFR, Est AFR Am: >60 mL/min (ref >60)
GFR, Estimated: >60 mL/min (ref >60)
Glucose, Bld: 215 mg/dL — ABNORMAL HIGH (ref 70–99)
Potassium: 3.7 mmol/L (ref 3.5–5.1)
Sodium: 138 mmol/L (ref 135–145)
Total Bilirubin: 0.6 mg/dL (ref 0.3–1.2)
Total Protein: 7.4 g/dL (ref 6.5–8.1)

## 2019-09-03 LAB — CBC WITH DIFFERENTIAL (CANCER CENTER ONLY)
Abs Immature Granulocytes: 0.03 10*3/uL (ref 0.00–0.07)
Basophils Absolute: 0 10*3/uL (ref 0.0–0.1)
Basophils Relative: 0 %
Eosinophils Absolute: 0.1 10*3/uL (ref 0.0–0.5)
Eosinophils Relative: 1 %
HCT: 38 % (ref 36.0–46.0)
Hemoglobin: 12.1 g/dL (ref 12.0–15.0)
Immature Granulocytes: 0 %
Lymphocytes Relative: 27 %
Lymphs Abs: 3.1 10*3/uL (ref 0.7–4.0)
MCH: 24.2 pg — ABNORMAL LOW (ref 26.0–34.0)
MCHC: 31.8 g/dL (ref 30.0–36.0)
MCV: 76 fL — ABNORMAL LOW (ref 80.0–100.0)
Monocytes Absolute: 0.7 10*3/uL (ref 0.1–1.0)
Monocytes Relative: 6 %
Neutro Abs: 7.3 10*3/uL (ref 1.7–7.7)
Neutrophils Relative %: 66 %
Platelet Count: 336 10*3/uL (ref 150–400)
RBC: 5 MIL/uL (ref 3.87–5.11)
RDW: 14.8 % (ref 11.5–15.5)
WBC Count: 11.2 10*3/uL — ABNORMAL HIGH (ref 4.0–10.5)
nRBC: 0 % (ref 0.0–0.2)

## 2019-09-03 LAB — IRON AND TIBC
Iron: 70 ug/dL (ref 41–142)
Saturation Ratios: 24 % (ref 21–57)
TIBC: 290 ug/dL (ref 236–444)
UIBC: 220 ug/dL (ref 120–384)

## 2019-09-03 LAB — RETICULOCYTES
Immature Retic Fract: 16.9 % — ABNORMAL HIGH (ref 2.3–15.9)
RBC.: 5.1 MIL/uL (ref 3.87–5.11)
Retic Count, Absolute: 79.6 10*3/uL (ref 19.0–186.0)
Retic Ct Pct: 1.6 % (ref 0.4–3.1)

## 2019-09-03 LAB — HEPATITIS B SURFACE ANTIBODY,QUALITATIVE: Hep B S Ab: NONREACTIVE

## 2019-09-03 LAB — HIV ANTIBODY (ROUTINE TESTING W REFLEX): HIV Screen 4th Generation wRfx: NONREACTIVE

## 2019-09-03 LAB — HEPATITIS B CORE ANTIBODY, TOTAL: Hep B Core Total Ab: NONREACTIVE

## 2019-09-03 LAB — SAVE SMEAR(SSMR), FOR PROVIDER SLIDE REVIEW

## 2019-09-03 LAB — FERRITIN: Ferritin: 357 ng/mL — ABNORMAL HIGH (ref 11–307)

## 2019-09-03 LAB — C-REACTIVE PROTEIN: CRP: 1.2 mg/dL — ABNORMAL HIGH (ref <1.0)

## 2019-09-03 LAB — HEPATITIS C ANTIBODY: HCV Ab: NONREACTIVE

## 2019-09-03 LAB — SEDIMENTATION RATE: Sed Rate: 37 mm/hr — ABNORMAL HIGH (ref 0–22)

## 2019-09-04 ENCOUNTER — Telehealth: Payer: Self-pay | Admitting: Adult Health

## 2019-09-04 LAB — ANTINUCLEAR ANTIBODIES, IFA: ANA Ab, IFA: NEGATIVE

## 2019-09-04 NOTE — Telephone Encounter (Signed)
Scheduled per 7/27 los. Called and spoke with pt, confirmed 7/27 appt

## 2019-09-05 ENCOUNTER — Other Ambulatory Visit: Payer: Self-pay | Admitting: Oncology

## 2019-09-06 ENCOUNTER — Telehealth: Payer: Self-pay | Admitting: Oncology

## 2019-09-06 NOTE — Telephone Encounter (Signed)
Have not scheduled appointments. Patient said she would be getting her lab work done at her PCP. Provider is aware of patient's decsion.

## 2019-11-08 ENCOUNTER — Ambulatory Visit: Payer: 59 | Admitting: Family Medicine

## 2019-11-28 ENCOUNTER — Other Ambulatory Visit: Payer: Self-pay | Admitting: Family Medicine

## 2019-11-28 DIAGNOSIS — E1169 Type 2 diabetes mellitus with other specified complication: Secondary | ICD-10-CM

## 2020-01-22 ENCOUNTER — Other Ambulatory Visit: Payer: Self-pay | Admitting: *Deleted

## 2020-01-22 MED ORDER — ONETOUCH DELICA PLUS LANCET33G MISC: 0 refills | Status: DC

## 2020-02-10 ENCOUNTER — Encounter: Payer: Self-pay | Admitting: Family Medicine

## 2020-02-17 ENCOUNTER — Other Ambulatory Visit: Payer: Self-pay | Admitting: Family Medicine

## 2020-02-17 DIAGNOSIS — E1169 Type 2 diabetes mellitus with other specified complication: Secondary | ICD-10-CM

## 2020-03-03 ENCOUNTER — Other Ambulatory Visit: Payer: Self-pay | Admitting: *Deleted

## 2020-03-03 DIAGNOSIS — I152 Hypertension secondary to endocrine disorders: Secondary | ICD-10-CM

## 2020-03-03 DIAGNOSIS — E1159 Type 2 diabetes mellitus with other circulatory complications: Secondary | ICD-10-CM

## 2020-03-05 ENCOUNTER — Other Ambulatory Visit: Payer: Self-pay | Admitting: *Deleted

## 2020-03-05 MED ORDER — ONETOUCH VERIO VI STRP: ORAL_STRIP | 0 refills | Status: DC

## 2020-03-25 ENCOUNTER — Other Ambulatory Visit: Payer: Self-pay | Admitting: Family Medicine

## 2020-04-22 ENCOUNTER — Other Ambulatory Visit: Payer: Self-pay | Admitting: Family Medicine

## 2020-04-23 ENCOUNTER — Encounter: Payer: Self-pay | Admitting: Family Medicine

## 2020-04-23 NOTE — Telephone Encounter (Signed)
[place]. NTBS Last OV June 2011

## 2020-06-03 ENCOUNTER — Other Ambulatory Visit: Payer: Self-pay | Admitting: Family Medicine

## 2020-06-03 DIAGNOSIS — E1169 Type 2 diabetes mellitus with other specified complication: Secondary | ICD-10-CM

## 2020-06-11 ENCOUNTER — Other Ambulatory Visit: Payer: Self-pay | Admitting: Family Medicine

## 2020-06-18 ENCOUNTER — Encounter: Payer: Self-pay | Admitting: Family Medicine

## 2020-06-18 ENCOUNTER — Other Ambulatory Visit: Payer: Self-pay | Admitting: Family Medicine

## 2020-06-18 DIAGNOSIS — E1159 Type 2 diabetes mellitus with other circulatory complications: Secondary | ICD-10-CM

## 2020-06-18 MED ORDER — LISINOPRIL 20 MG PO TABS: 20.0000 mg | ORAL_TABLET | Freq: Every day | ORAL | 0 refills | Status: DC

## 2020-06-20 ENCOUNTER — Other Ambulatory Visit: Payer: Self-pay | Admitting: Family Medicine

## 2020-07-22 ENCOUNTER — Ambulatory Visit (INDEPENDENT_AMBULATORY_CARE_PROVIDER_SITE_OTHER): Payer: No Typology Code available for payment source | Admitting: Family Medicine

## 2020-07-22 ENCOUNTER — Encounter: Payer: Self-pay | Admitting: Family Medicine

## 2020-07-22 ENCOUNTER — Other Ambulatory Visit: Payer: Self-pay

## 2020-07-22 VITALS — BP 132/63 | HR 92 | Temp 98.3°F | Ht 64.0 in | Wt 264.6 lb

## 2020-07-22 DIAGNOSIS — D72829 Elevated white blood cell count, unspecified: Secondary | ICD-10-CM | POA: Diagnosis not present

## 2020-07-22 DIAGNOSIS — R600 Localized edema: Secondary | ICD-10-CM

## 2020-07-22 DIAGNOSIS — E1169 Type 2 diabetes mellitus with other specified complication: Secondary | ICD-10-CM | POA: Diagnosis not present

## 2020-07-22 DIAGNOSIS — E785 Hyperlipidemia, unspecified: Secondary | ICD-10-CM

## 2020-07-22 DIAGNOSIS — Z0001 Encounter for general adult medical examination with abnormal findings: Secondary | ICD-10-CM

## 2020-07-22 DIAGNOSIS — I152 Hypertension secondary to endocrine disorders: Secondary | ICD-10-CM

## 2020-07-22 DIAGNOSIS — E1159 Type 2 diabetes mellitus with other circulatory complications: Secondary | ICD-10-CM | POA: Diagnosis not present

## 2020-07-22 DIAGNOSIS — M7662 Achilles tendinitis, left leg: Secondary | ICD-10-CM

## 2020-07-22 DIAGNOSIS — Z23 Encounter for immunization: Secondary | ICD-10-CM | POA: Diagnosis not present

## 2020-07-22 DIAGNOSIS — Z0000 Encounter for general adult medical examination without abnormal findings: Secondary | ICD-10-CM

## 2020-07-22 DIAGNOSIS — Z6841 Body Mass Index (BMI) 40.0 and over, adult: Secondary | ICD-10-CM

## 2020-07-22 LAB — BAYER DCA HB A1C WAIVED: HB A1C (BAYER DCA - WAIVED): 7 % — ABNORMAL HIGH (ref <7.0)

## 2020-07-22 MED ORDER — ATORVASTATIN CALCIUM 40 MG PO TABS: 40.0000 mg | ORAL_TABLET | Freq: Every day | ORAL | 3 refills | Status: DC

## 2020-07-22 MED ORDER — LISINOPRIL 20 MG PO TABS: 20.0000 mg | ORAL_TABLET | Freq: Every day | ORAL | 3 refills | Status: DC

## 2020-07-22 MED ORDER — HYDROCHLOROTHIAZIDE 25 MG PO TABS: 12.5000 mg | ORAL_TABLET | Freq: Every day | ORAL | 3 refills | Status: DC | PRN

## 2020-07-22 MED ORDER — METFORMIN HCL ER 500 MG PO TB24
1000.0000 mg | ORAL_TABLET | Freq: Every day | ORAL | 3 refills | Status: DC
Start: 2020-07-22 — End: 2020-10-05

## 2020-07-22 NOTE — Progress Notes (Signed)
[name] is a 50 y.o. female presents to office today for annual physical exam examination.    Concerns today include: 1. Type 2 Diabetes with hypertension, hyperlipidemia:  Patient is currently using a Dexcom which she was able to get free from her job.  She is really enjoying this.  She still occasionally checks with her point-of-care meter but more often than not just relies on the Dexcom.  She is compliant with Rybelsus 3 mg daily, metformin extended release 1000 mg daily and her lisinopril 20 mg daily.  She has not been taking her Lipitor because she ran out of it.  She has been off of that for about 2 months now.  She unfortunately was lost to follow-up last year because of her insurance.  She has since switched.  Last eye exam: needs Last foot exam: needs Last A1c:  Lab Results  Component Value Date   HGBA1C 7.5 (H) 08/05/2019   Nephropathy screen indicated?: on ACE-I Last flu, zoster and/or pneumovax:  Immunization History  Administered Date(s) Administered   Influenza,inj,Quad PF,6+ Mos 12/03/2012, 11/07/2013, 04/28/2015, 02/23/2016, 02/24/2017, 03/08/2018, 01/16/2019   PFIZER(Purple Top)SARS-COV-2 Vaccination 06/21/2019, 07/19/2019   Pneumococcal Polysaccharide-23 07/22/2020   Td 01/16/2019   Zoster Recombinat (Shingrix) 07/22/2020    ROS: no dizziness, LOC, polyuria, polydipsia, unintended weight loss/gain, foot ulcerations, numbness or tingling in extremities, shortness of breath or chest pain.   Diet: Is trying to keep a stricter diet in efforts to improve her weight and sugar, Exercise: Tries to stay active Last colonoscopy: Has not yet had Last mammogram: Up-to-date.  This was normal in January Last pap smear: Up-to-date.  Sees GYN normal Pap in January Refills needed today: All Immunizations needed: Pneumococcal and shingles vaccine is due. Immunization History  Administered Date(s) Administered   Influenza,inj,Quad PF,6+ Mos 12/03/2012, 11/07/2013,  04/28/2015, 02/23/2016, 02/24/2017, 03/08/2018, 01/16/2019   PFIZER(Purple Top)SARS-COV-2 Vaccination 06/21/2019, 07/19/2019   Pneumococcal Polysaccharide-23 07/22/2020   Td 01/16/2019   Zoster Recombinat (Shingrix) 07/22/2020     Past Medical History:  Diagnosis Date   Abnormal Pap smear 2202   colpo   Complication of anesthesia    Takes awhile for patient to awake.   Cystic fibrosis carrier    FHx: heart disease    FHx: hypertension    FHx: scoliosis    H/O candidiasis    H/O chlamydia infection 2006   treated x 1   Hx: UTI (urinary tract infection)    x 1   Hyperlipidemia    Hypertension    Increased BMI    Obesity    Vulvitis 04/27/06   Social History   Socioeconomic History   Marital status: Single    Spouse name: Not on file   Number of children: Not on file   Years of education: Not on file   Highest education level: Not on file  Occupational History   Not on file  Tobacco Use   Smoking status: Never   Smokeless tobacco: Never  Substance and Sexual Activity   Alcohol use: No   Drug use: No   Sexual activity: Yes  Other Topics Concern   Not on file  Social History Narrative   Not on file   Social Determinants of Health   Financial Resource Strain: Not on file  Food Insecurity: Not on file  Transportation Needs: Not on file  Physical Activity: Not on file  Stress: Not on file  Social Connections: Not on file  Intimate Partner Violence: Not on file  Past Surgical History:  Procedure Laterality Date   CESAREAN SECTION     x2   WISDOM TOOTH EXTRACTION     Family History  Problem Relation Age of Onset   Heart disease Mother    Hypertension Mother    Diabetes Mother    Hypertension Sister    Alcohol abuse Maternal Uncle    Mental illness Paternal Grandmother        alzheimer's   Diabetes Paternal Grandmother     Current Outpatient Medications:    atorvastatin (LIPITOR) 40 MG tablet, Take 1 tablet (40 mg total) by mouth daily. (Needs to  be seen before next refill), Disp: 30 tablet, Rfl: 0   Blood Glucose Monitoring Suppl (ONETOUCH VERIO REFLECT) w/Device KIT, 1 each by Does not apply route 3 (three) times daily. Dx E11.9, Disp: 1 kit, Rfl: 0   cholecalciferol (VITAMIN D3) 25 MCG (1000 UNIT) tablet, Take 2,000 Units by mouth daily., Disp: , Rfl:    glucose blood (ONETOUCH VERIO) test strip, TEST BLOOD SUGAR 3 TIMES  DAILY, Disp: 150 strip, Rfl: 0   Lancets (ONETOUCH DELICA PLUS AJOINO67E) MISC, TEST BLOOD SUGAR 3 TIMES  DAILY Dx E11.9, Disp: 300 each, Rfl: 3   levonorgestrel (MIRENA, 52 MG,) 20 MCG/24HR IUD, Mirena 20 mcg/24 hours (5 yrs) 52 mg intrauterine device  Take 1 device by intrauterine route., Disp: , Rfl:    lisinopril (ZESTRIL) 20 MG tablet, Take 1 tablet (20 mg total) by mouth daily., Disp: 90 tablet, Rfl: 0   metFORMIN (GLUCOPHAGE-XR) 500 MG 24 hr tablet, TAKE 2 TABLETS BY MOUTH  DAILY WITH BREAKFAST, Disp: 180 tablet, Rfl: 0   Semaglutide (RYBELSUS) 3 MG TABS, Take 3 mg by mouth daily., Disp: 90 tablet, Rfl: 3  Allergies  Allergen Reactions   Percocet [Oxycodone-Acetaminophen]      ROS: Review of Systems Pertinent items noted in HPI and remainder of comprehensive ROS otherwise negative.    Physical exam BP 132/63   Pulse 92   Temp 98.3 F (36.8 C)   Ht 5' 4" (1.626 m)   Wt 264 lb 9.6 oz (120 kg)   SpO2 100%   BMI 45.42 kg/m  General appearance: alert, cooperative, appears stated age, no distress, and moderately obese Head: Normocephalic, without obvious abnormality, atraumatic Eyes: negative findings: lids and lashes normal, conjunctivae and sclerae normal, corneas clear, and pupils equal, round, reactive to light and accomodation Ears: normal TM's and external ear canals both ears Nose: Nares normal. Septum midline. Mucosa normal. No drainage or sinus tenderness. Throat: lips, mucosa, and tongue normal; teeth and gums normal Neck: no adenopathy, no carotid bruit, supple, symmetrical, trachea midline,  and thyroid not enlarged, symmetric, no tenderness/mass/nodules Back: symmetric, no curvature. ROM normal. No CVA tenderness. Lungs: clear to auscultation bilaterally Heart: regular rate and rhythm, S1, S2 normal, no murmur, click, rub or gallop Abdomen: soft, non-tender; bowel sounds normal; no masses,  no organomegaly Extremities: edema 1+ pitting; warm, well perfused Pulses: 2+ and symmetric Skin: Skin color, texture, turgor normal. No rashes or lesions Lymph nodes: Cervical, supraclavicular, and axillary nodes normal. Neurologic: Grossly normal  Heel: Has full plantar and dorsiflexion of the left foot.  No palpable bony abnormalities noted along the Achilles tendon insertion.  No significant Achilles tendon thickening or nodules noted.  Diabetic Foot Exam - Simple   Simple Foot Form Diabetic Foot exam was performed with the following findings: Yes 07/22/2020  5:31 PM  Visual Inspection No deformities, no ulcerations, no other skin  breakdown bilaterally: Yes Sensation Testing Intact to touch and monofilament testing bilaterally: Yes Pulse Check Posterior Tibialis and Dorsalis pulse intact bilaterally: Yes Comments     Assessment/ Plan: [name] here for annual physical exam.   Annual physical exam  Type 2 diabetes mellitus with other specified complication, without long-term current use of insulin ([place]) - Plan: CMP14+EGFR, Bayer DCA Hb A1c Waived, metFORMIN (GLUCOPHAGE-XR) 500 MG 24 hr tablet  Hypertension associated with diabetes ([place]) - Plan: CMP14+EGFR, lisinopril (ZESTRIL) 20 MG tablet  Hyperlipidemia associated with type 2 diabetes mellitus ([place]) - Plan: CMP14+EGFR, LDL Cholesterol, Direct, atorvastatin (LIPITOR) 40 MG tablet  Morbid obesity with BMI of 40.0-44.9, adult ([facility])  Leukocytosis, unspecified type - Plan: CBC with Differential  Pedal edema - Plan: hydrochlorothiazide (HYDRODIURIL) 25 MG tablet  Achilles tendinitis of left lower extremity  Due for  colon cancer screening.  She has had a Pap smear and mammogram completed.  Both were normal.  Pneumococcal vaccination administered.  Blood pressure is well controlled.  Continue current regimen.  Have given her as needed diuretic to have for edema.  She is actively working on lifestyle modification.  Her blood sugar was at goal today with A1c of 7.0.  No changes to medications made.  Would like to follow-up in 3 months since it is borderline.  May need to consider advancing Rybelsus to 7 mg daily.  Clinically I think that the pain that she is been having below the left heel is an Achilles tendinitis.  I have given her home physical therapy handout.  I have also recommended oral NSAIDs and ice to the affected area.  If symptoms are refractory to these treatments we will plan to refer her to Dr. Micheline Chapman for consideration of further intervention.  Counseled on healthy lifestyle choices, including diet (rich in fruits, vegetables and lean meats and low in salt and simple carbohydrates) and exercise (at least 30 minutes of moderate physical activity daily).  Patient to follow up in 1 year for annual exam or sooner if needed.  Ashly M. Lajuana Ripple, DO

## 2020-07-22 NOTE — Patient Instructions (Signed)
You had labs performed today.  You will be contacted with the results of the labs once they are available, usually in the next 3 business days for routine lab work.  If you have an active my chart account, they will be released to your MyChart.  If you prefer to have these labs released to you via telephone, please let us know.  If you had a pap smear or biopsy performed, expect to be contacted in about 7-10 days.  Diabetes Mellitus and Standards of Medical Care Living with and managing diabetes (diabetes mellitus) can be complicated. Your diabetes treatment may be managed by a team of health care providers, including: A physician who specializes in diabetes (endocrinologist). You might also have visits with a nurse practitioner or physician assistant. Nurses. A registered dietitian. A certified diabetes care and education specialist. An exercise specialist. A pharmacist. An eye doctor. A foot specialist (podiatrist). A dental care provider. A primary care provider. A mental health care provider. How to manage your diabetes You can do many things to successfully manage your diabetes. Your health care providers will follow guidelines to help you get the best quality of care. Here are general guidelines for your diabetes management plan. Your health careproviders may give you more specific instructions. Physical exams When you are diagnosed with diabetes, and each year after that, your health care provider will ask about your medical and family history. You will have a physical exam, which may include: Measuring your height, weight, and body mass index (BMI). Checking your blood pressure. This will be done at every routine medical visit. Your target blood pressure may vary depending on your medical conditions, your age, and other factors. A thyroid exam. A skin exam. Screening for nerve damage (peripheral neuropathy). This may include checking the pulse in your legs and feet and the level of  sensation in your hands and feet. A foot exam to inspect the structure and skin of your feet, including checking for cuts, bruises, redness, blisters, sores, or other problems. Screening for blood vessel (vascular) problems. This may include checking the pulse in your legs and feet and checking your temperature. Blood tests Depending on your treatment plan and your personal needs, you may have the following tests: Hemoglobin A1C (HbA1C). This test provides information about blood sugar (glucose) control over the previous 2-3 months. It is used to adjust your treatment plan, if needed. This test will be done: At least 2 times a year, if you are meeting your treatment goals. 4 times a year, if you are not meeting your treatment goals or if your goals have changed. Lipid testing, including total cholesterol, LDL and HDL cholesterol, and triglyceride levels. The goal for LDL is less than 100 mg/dL (5.5 mmol/L). If you are at high risk for complications, the goal is less than 70 mg/dL (3.9 mmol/L). The goal for HDL is 40 mg/dL (2.2 mmol/L) or higher for men, and 50 mg/dL (2.8 mmol/L) or higher for women. An HDL cholesterol of 60 mg/dL (3.3 mmol/L) or higher gives some protection against heart disease. The goal for triglycerides is less than 150 mg/dL (8.3 mmol/L). Liver function tests. Kidney function tests. Thyroid function tests.  Dental and eye exams  Visit your dentist two times a year. If you have type 1 diabetes, your health care provider may recommend an eye exam within 5 years after you are diagnosed, and then once a year after your first exam. For children with type 1 diabetes, the health care provider  may recommend an eye exam when your child is age 52 or older and has had diabetes for 3-5 years. After the first exam, your child should get an eye exam once a year. If you have type 2 diabetes, your health care provider may recommend an eye exam as soon as you are diagnosed, and then every  1-2 years after your first exam.  Immunizations A yearly flu (influenza) vaccine is recommended annually for everyone 6 months or older. This is especially important if you have diabetes. The pneumonia (pneumococcal) vaccine is recommended for everyone 2 years or older who has diabetes. If you are age 74 or older, you may get the pneumonia vaccine as a series of two separate shots. The hepatitis B vaccine is recommended for adults shortly after being diagnosed with diabetes. Adults and children with diabetes should receive all other vaccines according to age-specific recommendations from the Centers for Disease Control andPrevention (CDC). Mental and emotional health Screening for symptoms of eating disorders, anxiety, and depression is recommended at the time of diagnosis and after as needed. If your screening shows that you have symptoms, you may need more evaluation. You may work with [employer] health care provider. Follow these instructions at home: Treatment plan You will monitor your blood glucose levels and may give yourself insulin. Your treatment plan will be reviewed at every medical visit. You and your health care provider will discuss: How you are taking your medicines, including insulin. Any side effects you have. Your blood glucose level target goals. How often you monitor your blood glucose level. Lifestyle habits, such as activity level and tobacco, alcohol, and substance use. Education Your health care provider will assess how well you are monitoring your blood glucose levels and whether you are taking your insulin and medicines correctly. He or she may refer you to: A certified diabetes care and education specialist to manage your diabetes throughout your life, starting at diagnosis. A registered dietitian who can create and review your personal nutrition plan. An exercise specialist who can discuss your activity level and exercise plan. General instructions Take  over-the-counter and prescription medicines only as told by your health care provider. Keep all follow-up visits. This is important. Where to find support There are many diabetes support networks, including: American Diabetes Association (ADA): diabetes.org Defeat Diabetes Foundation: defeatdiabetes.org Where to find more information American Diabetes Association (ADA): www.diabetes.org Association of Diabetes Care & Education Specialists (ADCES): diabeteseducator.org International Diabetes Federation (IDF): http://hill.biz/ Summary Managing diabetes (diabetes mellitus) can be complicated. Your diabetes treatment may be managed by a team of health care providers. Your health care providers follow guidelines to help you get the best quality care. You should have physical exams, blood tests, blood pressure monitoring, immunizations, and screening tests regularly. Stay updated on how to manage your diabetes. Your health care providers may also give you more specific instructions based on your individual health. This information is not intended to replace advice given to you by your health care provider. Make sure you discuss any questions you have with your healthcare provider. Document Revised: 08/01/2019 Document Reviewed: 08/01/2019 Elsevier Patient Education  2022 [employer].

## 2020-07-23 LAB — CMP14+EGFR
ALT: 29 IU/L (ref 0–32)
AST: 19 IU/L (ref 0–40)
Albumin/Globulin Ratio: 1.4 (ref 1.2–2.2)
Albumin: 4.2 g/dL (ref 3.8–4.8)
Alkaline Phosphatase: 76 IU/L (ref 44–121)
BUN/Creatinine Ratio: 15 (ref 9–23)
BUN: 13 mg/dL (ref 6–24)
Bilirubin Total: 0.4 mg/dL (ref 0.0–1.2)
CO2: 19 mmol/L — ABNORMAL LOW (ref 20–29)
Calcium: 10.2 mg/dL (ref 8.7–10.2)
Chloride: 99 mmol/L (ref 96–106)
Creatinine, Ser: 0.85 mg/dL (ref 0.57–1.00)
Globulin, Total: 3 g/dL (ref 1.5–4.5)
Glucose: 96 mg/dL (ref 65–99)
Potassium: 4.1 mmol/L (ref 3.5–5.2)
Sodium: 138 mmol/L (ref 134–144)
Total Protein: 7.2 g/dL (ref 6.0–8.5)
eGFR: 83 mL/min/{1.73_m2} (ref >59)

## 2020-07-23 LAB — CBC WITH DIFFERENTIAL/PLATELET
Basophils Absolute: 0 10*3/uL (ref 0.0–0.2)
Basos: 0 %
EOS (ABSOLUTE): 0.1 10*3/uL (ref 0.0–0.4)
Eos: 1 %
Hematocrit: 41.6 % (ref 34.0–46.6)
Hemoglobin: 13.3 g/dL (ref 11.1–15.9)
Immature Grans (Abs): 0 10*3/uL (ref 0.0–0.1)
Immature Granulocytes: 0 %
Lymphocytes Absolute: 4.1 10*3/uL — ABNORMAL HIGH (ref 0.7–3.1)
Lymphs: 35 %
MCH: 24.5 pg — ABNORMAL LOW (ref 26.6–33.0)
MCHC: 32 g/dL (ref 31.5–35.7)
MCV: 77 fL — ABNORMAL LOW (ref 79–97)
Monocytes Absolute: 0.8 10*3/uL (ref 0.1–0.9)
Monocytes: 7 %
Neutrophils Absolute: 6.5 10*3/uL (ref 1.4–7.0)
Neutrophils: 57 %
Platelets: 349 10*3/uL (ref 150–450)
RBC: 5.42 x10E6/uL — ABNORMAL HIGH (ref 3.77–5.28)
RDW: 14.5 % (ref 11.7–15.4)
WBC: 11.5 10*3/uL — ABNORMAL HIGH (ref 3.4–10.8)

## 2020-07-23 LAB — LDL CHOLESTEROL, DIRECT: LDL Direct: 118 mg/dL — ABNORMAL HIGH (ref 0–99)

## 2020-08-12 ENCOUNTER — Other Ambulatory Visit: Payer: Self-pay | Admitting: Family Medicine

## 2020-08-12 DIAGNOSIS — E1159 Type 2 diabetes mellitus with other circulatory complications: Secondary | ICD-10-CM

## 2020-09-02 ENCOUNTER — Inpatient Hospital Stay (HOSPITAL_BASED_OUTPATIENT_CLINIC_OR_DEPARTMENT_OTHER): Payer: No Typology Code available for payment source | Admitting: Oncology

## 2020-09-02 DIAGNOSIS — D72828 Other elevated white blood cell count: Secondary | ICD-10-CM | POA: Diagnosis not present

## 2020-09-02 DIAGNOSIS — D72829 Elevated white blood cell count, unspecified: Secondary | ICD-10-CM | POA: Diagnosis not present

## 2020-09-02 DIAGNOSIS — D563 Thalassemia minor: Secondary | ICD-10-CM | POA: Diagnosis not present

## 2020-09-02 NOTE — Progress Notes (Signed)
[name]  [phone] [phone]     ID: [name] DOB: May 12, 1970  MR#: [id number]  HWE#:[id number]  Patient Care Team: Mariah Norlander, DO as PCP - General (Family Medicine) [name] OTHER MD:  I connected with [name] on 09/02/20 at  1:00 PM EDT by video and verified that I am speaking with the correct person using two identifiers.   I discussed the limitations, risks, security and privacy concerns of performing an evaluation and management service by telemedicine and the availability of in-person appointments. I also discussed with the patient that there may be a patient responsible charge related to this service. The patient expressed understanding and agreed to proceed.   Other persons participating in the visit and their role in the encounter: None  Patient's location: Home Provider's location: [place]  Total time spent: 15 min   CHIEF COMPLAINT: leukocytosis    INTERVAL HISTORY: [name] was contacted today for follow up of leukocytosis.  She tells me she feels fine and there have been no developments since the last time we saw her.  There has been no particular trend in her white cell count, which continues mildly elevated. WBC 3.4 - 10.8 x10E3/uL 11.5 High   11.2 High  R  15.2 High   15.1 High   12.0 High   14.2 High   9.6     REVIEW OF SYSTEMS: A detailed review of systems today was otherwise noncontributory   COVID 19 VACCINATION STATUS: [place] x2, most recently 07/2019   HISTORY OF CURRENT ILLNESS: From the original intake note:  [name] is a 50 year old woman who is referred by Dr. Lajuana Blair for evaluation of her persistent leukocytosis.  This leukocytosis was initially noted on lab work beginning in March, 2021.  See her WBC trend below:   Results for [name] (MRN [id number]) as of 09/01/2019 09:29  Ref. Range 01/16/2019 11:57 05/01/2019 16:16 05/08/2019 13:46  08/05/2019 16:26 08/05/2019 16:26  WBC Latest Ref Range: 3.4 - 10.8 x10E3/uL 9.6 14.2 (H) 12.0 (H) 15.1 (H) 15.2 (H)   [name] has lost about 14 pounds since June of 2020.  She denies any change in the winter of 2020-2021 that she can identify, such as a new ache, pain, or symptom, other than some pelvic pressure that was initially noticed with intercourse during climax, that resolved, and didn't happen since that time.    The patient's subsequent history is as detailed below.   PAST MEDICAL HISTORY: Past Medical History:  Diagnosis Date   Abnormal Pap smear 1025   colpo   Complication of anesthesia    Takes awhile for patient to awake.   Cystic fibrosis carrier    FHx: heart disease    FHx: hypertension    FHx: scoliosis    H/O candidiasis    H/O chlamydia infection 2006   treated x 1   Hx: UTI (urinary tract infection)    x 1   Hyperlipidemia    Hypertension    Increased BMI    Obesity    Vulvitis 04/27/06    PAST SURGICAL HISTORY: Past Surgical History:  Procedure Laterality Date   CESAREAN SECTION     x2   WISDOM TOOTH EXTRACTION      FAMILY HISTORY Family History  Problem Relation Age of Onset   Heart disease Mother    Hypertension Mother    Diabetes Mother    Hypertension Sister  Alcohol abuse Maternal Uncle    Mental illness Paternal Grandmother        alzheimer's   Diabetes Paternal Grandmother     GYNECOLOGIC HISTORY:  No LMP recorded. (Menstrual status: IUD). Menarche: 50 years old Age at first live birth: 50 years old GX P 2 LMP 12 years ago Contraceptive IUD in place, was on OCP x 16 years previously HRT none  Hysterectomy? no Salpingo-oophorectomy?no   SOCIAL HISTORY:  [name] is single, lives with her two sons, ages 60 and 105 who are going to local school.  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  She does not smoke, drink ETOH regularly, or use drugs.    ADVANCED DIRECTIVES: Not in place   HEALTH  MAINTENANCE: Social History   Tobacco Use   Smoking status: Never   Smokeless tobacco: Never  Substance Use Topics   Alcohol use: No   Drug use: No     Colonoscopy: to be scheduled  PAP: 2021  Mammo: 2021   Allergies  Allergen Reactions   Percocet [Oxycodone-Acetaminophen]     Current Outpatient Medications  Medication Sig Dispense Refill   atorvastatin (LIPITOR) 40 MG tablet Take 1 tablet (40 mg total) by mouth daily. 90 tablet 3   Blood Glucose Monitoring Suppl (ONETOUCH VERIO REFLECT) w/Device KIT 1 each by Does not apply route 3 (three) times daily. Dx E11.9 1 kit 0   cholecalciferol (VITAMIN D3) 25 MCG (1000 UNIT) tablet Take 2,000 Units by mouth daily.     glucose blood (ONETOUCH VERIO) test strip TEST BLOOD SUGAR 3 TIMES  DAILY 150 strip 0   hydrochlorothiazide (HYDRODIURIL) 25 MG tablet Take 0.5-1 tablets (12.5-25 mg total) by mouth daily as needed (swelling). 90 tablet 3   Lancets (ONETOUCH DELICA PLUS GLOVFI43P) MISC TEST BLOOD SUGAR 3 TIMES  DAILY Dx E11.9 300 each 3   levonorgestrel (MIRENA) 20 MCG/24HR IUD Mirena 20 mcg/24 hours (5 yrs) 52 mg intrauterine device  Take 1 device by intrauterine route.     lisinopril (ZESTRIL) 20 MG tablet Take 1 tablet (20 mg total) by mouth daily. 90 tablet 3   metFORMIN (GLUCOPHAGE-XR) 500 MG 24 hr tablet Take 2 tablets (1,000 mg total) by mouth daily with breakfast. 180 tablet 3   Semaglutide (RYBELSUS) 3 MG TABS Take 3 mg by mouth daily. 90 tablet 3   No current facility-administered medications for this visit.    OBJECTIVE: African-American woman who appears well  There were no vitals filed for this visit.    There is no height or weight on file to calculate BMI.   Wt Readings from Last 3 Encounters:  07/22/20 264 lb 9.6 oz (120 kg)  09/03/19 (!) 274 lb 9.6 oz (124.6 kg)  08/05/19 270 lb 3.2 oz (122.6 kg)     ECOG FS:0 - Asymptomatic  Telemedicine visit 09/02/2020  LAB RESULTS:  CMP     Component Value Date/Time    NA 138 07/22/2020 1612   K 4.1 07/22/2020 1612   CL 99 07/22/2020 1612   CO2 19 (L) 07/22/2020 1612   GLUCOSE 96 07/22/2020 1612   GLUCOSE 215 (H) 09/03/2019 1124   BUN 13 07/22/2020 1612   CREATININE 0.85 07/22/2020 1612   CREATININE 0.94 09/03/2019 1124   CALCIUM 10.2 07/22/2020 1612   PROT 7.2 07/22/2020 1612   ALBUMIN 4.2 07/22/2020 1612   AST 19 07/22/2020 1612   AST 24 09/03/2019 1124   ALT 29 07/22/2020 1612  ALT 37 09/03/2019 1124   ALKPHOS 76 07/22/2020 1612   BILITOT 0.4 07/22/2020 1612   BILITOT 0.6 09/03/2019 1124   GFRNONAA >60 09/03/2019 1124   GFRAA >60 09/03/2019 1124    No results found for: TOTALPROTELP, ALBUMINELP, A1GS, A2GS, BETS, BETA2SER, GAMS, MSPIKE, SPEI  No results found for: Nils Pyle, [facility]  Lab Results  Component Value Date   WBC 11.5 (H) 07/22/2020   NEUTROABS 6.5 07/22/2020   HGB 13.3 07/22/2020   HCT 41.6 07/22/2020   MCV 77 (L) 07/22/2020   PLT 349 07/22/2020      Chemistry      Component Value Date/Time   NA 138 07/22/2020 1612   K 4.1 07/22/2020 1612   CL 99 07/22/2020 1612   CO2 19 (L) 07/22/2020 1612   BUN 13 07/22/2020 1612   CREATININE 0.85 07/22/2020 1612   CREATININE 0.94 09/03/2019 1124      Component Value Date/Time   CALCIUM 10.2 07/22/2020 1612   ALKPHOS 76 07/22/2020 1612   AST 19 07/22/2020 1612   AST 24 09/03/2019 1124   ALT 29 07/22/2020 1612   ALT 37 09/03/2019 1124   BILITOT 0.4 07/22/2020 1612   BILITOT 0.6 09/03/2019 1124       No results found for: LABCA2  No components found for: HXTAVW979  No results for input(s): INR in the last 168 hours.  No results found for: LABCA2  No results found for: YIA165  No results found for: VVZ482  No results found for: LMB867  No results found for: CA2729  No components found for: HGQUANT  No results found for: CEA1 / No results found for: CEA1   No results found for: AFPTUMOR  No results found for: CHROMOGRNA  No results  found for: TOTALPROTELP, ALBUMINELP, A1GS, A2GS, BETS, BETA2SER, GAMS, MSPIKE, SPEI (this displays SPEP labs)  No results found for: KPAFRELGTCHN, LAMBDASER, KAPLAMBRATIO (kappa/lambda light chains)  No results found for: HGBA, HGBA2QUANT, HGBFQUANT, HGBSQUAN (Hemoglobinopathy evaluation)   Lab Results  Component Value Date   LDH 154 08/05/2019    Lab Results  Component Value Date   IRON 70 09/03/2019   TIBC 290 09/03/2019   IRONPCTSAT 24 09/03/2019   (Iron and TIBC)  Lab Results  Component Value Date   FERRITIN 357 (H) 09/03/2019    Urinalysis    Component Value Date/Time   APPEARANCEUR Clear 05/01/2019 1621   PHURINE 4.5 07/15/2011 0957   GLUCOSEU Negative 05/01/2019 1621   BILIRUBINUR Negative 05/01/2019 1621   PROTEINUR 2+ (A) 05/01/2019 1621   NITRITE Negative 05/01/2019 1621   LEUKOCYTESUR Negative 05/01/2019 1621    STUDIES: No results found.   ASSESSMENT: 50 y.o. [place], [place] woman with leukocytosis first noted in 04/2019, stable, chronic, benign   PLAN: [name] has a persistent mild benign leukocytosis which I believe is related to her hyperestrogenic state due to her obesity.  Fat tissue of course is an Event organiser.  Pregnant women also have leukocytosis at least partly through a similar mechanism.  I certainly have no better explanation in her case.  A very extensive laboratory evaluation including C-reactive protein, sedimentation rate, hepatitis studies, HIV, ANA, and review of the peripheral blood film showed no findings of concern and certainly nothing that would suggest leukemia or primary bone marrow problem.  We did review the fact that she has small red cells in the face of a high ferritin and normal iron stores.  This indicates a thalassemia most likely an alpha thalassemia.  This is of no clinical concern as far as her situation is concerned.  The only reason to noted is so that an assumption will not be made that because of the low MCV she is  iron deficient.  To document iron deficiency in her case will require ferritin and iron saturation studies  At this point I feel comfortable releasing her from follow-up here.  She has a good understanding of her situation.  If there is a significant trend upward so that her white cell count rises above 20,000 repeatedly we would want to see her again   Mariah Jews C. Epimenio Schetter, MD 09/02/20 10:15 AM Medical Oncology and Hematology [facility] [place], [place] [address] Tel. [phone]    Fax. [phone]   I, Wilburn Mylar, am acting as scribe for Dr. Virgie Dad. [name].  I, Lurline Del MD, have reviewed the above documentation for accuracy and completeness, and I agree with the above.    *Total Encounter Time as defined by the Centers for Medicare and Medicaid Services includes, in addition to the face-to-face time of a patient visit (documented in the note above) non-face-to-face time: obtaining and reviewing outside history, ordering and reviewing medications, tests or procedures, care coordination (communications with other health care professionals or caregivers) and documentation in the medical record.

## 2020-09-06 ENCOUNTER — Encounter: Payer: Self-pay | Admitting: Oncology

## 2020-10-05 ENCOUNTER — Other Ambulatory Visit: Payer: Self-pay | Admitting: Family Medicine

## 2020-10-05 DIAGNOSIS — I152 Hypertension secondary to endocrine disorders: Secondary | ICD-10-CM

## 2020-10-05 DIAGNOSIS — E1169 Type 2 diabetes mellitus with other specified complication: Secondary | ICD-10-CM

## 2020-10-05 DIAGNOSIS — E1159 Type 2 diabetes mellitus with other circulatory complications: Secondary | ICD-10-CM

## 2020-10-26 ENCOUNTER — Ambulatory Visit: Payer: No Typology Code available for payment source | Admitting: Family Medicine

## 2020-12-11 ENCOUNTER — Other Ambulatory Visit: Payer: Self-pay | Admitting: Family Medicine

## 2020-12-11 DIAGNOSIS — E1159 Type 2 diabetes mellitus with other circulatory complications: Secondary | ICD-10-CM

## 2020-12-11 NOTE — Telephone Encounter (Signed)
OV 07/22/20 rtc 1 yr

## 2020-12-28 ENCOUNTER — Other Ambulatory Visit: Payer: Self-pay | Admitting: Family Medicine

## 2020-12-28 DIAGNOSIS — E119 Type 2 diabetes mellitus without complications: Secondary | ICD-10-CM

## 2020-12-29 ENCOUNTER — Encounter: Payer: Self-pay | Admitting: Family Medicine

## 2020-12-29 NOTE — Telephone Encounter (Signed)
Made appt on Jan 10

## 2020-12-29 NOTE — Telephone Encounter (Signed)
30 day supply sent in today-ntbs for further refills for Diabetes follow up

## 2021-01-18 ENCOUNTER — Other Ambulatory Visit: Payer: Self-pay | Admitting: Family Medicine

## 2021-01-18 DIAGNOSIS — E1169 Type 2 diabetes mellitus with other specified complication: Secondary | ICD-10-CM

## 2021-02-12 ENCOUNTER — Telehealth: Payer: Self-pay | Admitting: Family Medicine

## 2021-02-15 NOTE — Telephone Encounter (Signed)
okay

## 2021-02-15 NOTE — Telephone Encounter (Signed)
Fine by me 

## 2021-02-15 NOTE — Telephone Encounter (Signed)
Pt aware changes are ok and pt scheduled

## 2021-02-16 ENCOUNTER — Ambulatory Visit: Payer: No Typology Code available for payment source | Admitting: Family Medicine

## 2021-02-22 ENCOUNTER — Ambulatory Visit: Payer: No Typology Code available for payment source | Admitting: Family Medicine

## 2021-02-22 ENCOUNTER — Encounter: Payer: Self-pay | Admitting: Family Medicine

## 2021-02-22 VITALS — BP 126/73 | HR 102 | Temp 97.7°F | Ht 64.0 in | Wt 265.0 lb

## 2021-02-22 DIAGNOSIS — E1159 Type 2 diabetes mellitus with other circulatory complications: Secondary | ICD-10-CM | POA: Diagnosis not present

## 2021-02-22 DIAGNOSIS — I152 Hypertension secondary to endocrine disorders: Secondary | ICD-10-CM

## 2021-02-22 DIAGNOSIS — E785 Hyperlipidemia, unspecified: Secondary | ICD-10-CM

## 2021-02-22 DIAGNOSIS — Z23 Encounter for immunization: Secondary | ICD-10-CM | POA: Diagnosis not present

## 2021-02-22 DIAGNOSIS — E1169 Type 2 diabetes mellitus with other specified complication: Secondary | ICD-10-CM

## 2021-02-22 DIAGNOSIS — D72829 Elevated white blood cell count, unspecified: Secondary | ICD-10-CM | POA: Diagnosis not present

## 2021-02-22 DIAGNOSIS — E119 Type 2 diabetes mellitus without complications: Secondary | ICD-10-CM

## 2021-02-22 LAB — BAYER DCA HB A1C WAIVED: HB A1C (BAYER DCA - WAIVED): 6.8 % — ABNORMAL HIGH (ref 4.8–5.6)

## 2021-02-22 MED ORDER — RYBELSUS 3 MG PO TABS: 1.0000 | ORAL_TABLET | Freq: Every day | ORAL | 3 refills | Status: DC

## 2021-02-22 MED ORDER — METFORMIN HCL ER 500 MG PO TB24: 1000.0000 mg | ORAL_TABLET | Freq: Every day | ORAL | 3 refills | Status: DC

## 2021-02-22 MED ORDER — LISINOPRIL 20 MG PO TABS: 20.0000 mg | ORAL_TABLET | Freq: Every day | ORAL | 1 refills | Status: DC

## 2021-02-22 NOTE — Progress Notes (Signed)
Subjective:  Patient ID: [name],  female    DOB: January 07, 1971  Age: 51 y.o.    CC: Medical Management of Chronic Issues   HPI [name] presents for  follow-up of hypertension. Patient has no history of headache chest pain or shortness of breath or recent cough. Patient also denies symptoms of TIA such as numbness weakness lateralizing. Patient denies side effects from medication. States taking it regularly.  Patient also  in for follow-up of elevated cholesterol. Doing well without complaints on current medication. Denies side effects  including myalgia and arthralgia and nausea. Also in today for liver function testing. Currently no chest pain, shortness of breath or other cardiovascular related symptoms noted.  Follow-up of diabetes. Patient does check blood sugar at home. Readings stay within the range on her phone. Big issue for her is food. Portion control. Sometimes will eat the wrong kind of snack.  Patient denies symptoms such as excessive hunger or urinary frequency, excessive hunger, nausea No significant hypoglycemic spells noted. Medications reviewed. Pt reports taking them regularly. Pt. denies complication/adverse reaction today.    History [name] has a past medical history of Abnormal Pap smear (4665), Complication of anesthesia, Cystic fibrosis carrier, FHx: heart disease, FHx: hypertension, FHx: scoliosis, H/O candidiasis, H/O chlamydia infection (2006), UTI (urinary tract infection), Hyperlipidemia, Hypertension, Increased BMI, Obesity, and Vulvitis (04/27/06).   She has a past surgical history that includes Wisdom tooth extraction and Cesarean section.   Her family history includes Alcohol abuse in her maternal uncle; Diabetes in her mother and paternal grandmother; Heart disease in her mother; Hypertension in her mother and sister; Mental illness in her paternal grandmother.She reports that she has never smoked. She has never used smokeless tobacco. She  reports that she does not drink alcohol and does not use drugs.  Current Outpatient Medications on File Prior to Visit  Medication Sig Dispense Refill   cholecalciferol (VITAMIN D3) 25 MCG (1000 UNIT) tablet Take 2,000 Units by mouth daily.     hydrochlorothiazide (HYDRODIURIL) 25 MG tablet Take 0.5-1 tablets (12.5-25 mg total) by mouth daily as needed (swelling). 90 tablet 3   levonorgestrel (MIRENA) 20 MCG/24HR IUD Mirena 20 mcg/24 hours (5 yrs) 52 mg intrauterine device  Take 1 device by intrauterine route.     atorvastatin (LIPITOR) 40 MG tablet Take 1 tablet (40 mg total) by mouth daily. (Patient not taking: Reported on 02/22/2021) 90 tablet 3   No current facility-administered medications on file prior to visit.    ROS Review of Systems  Constitutional: Negative.   HENT: Negative.    Eyes:  Negative for visual disturbance.  Respiratory:  Negative for shortness of breath.   Cardiovascular:  Negative for chest pain.  Gastrointestinal:  Negative for abdominal pain.  Musculoskeletal:  Negative for arthralgias.   Objective:  BP 126/73    Pulse (!) 102    Temp 97.7 F (36.5 C) (Temporal)    Ht _0  (1.626 m)    Wt 265 lb (120.2 kg)    SpO2 99%    BMI 45.49 kg/m   BP Readings from Last 3 Encounters:  02/22/21 126/73  07/22/20 132/63  09/03/19 (!) 149/90    Wt Readings from Last 3 Encounters:  02/22/21 265 lb (120.2 kg)  07/22/20 264 lb 9.6 oz (120 kg)  09/03/19 (!) 274 lb 9.6 oz (124.6 kg)     Physical Exam Constitutional:      General: She is not in acute distress.  Appearance: She is well-developed.  Cardiovascular:     Rate and Rhythm: Normal rate and regular rhythm.  Pulmonary:     Breath sounds: Normal breath sounds.  Musculoskeletal:        General: Normal range of motion.  Skin:    General: Skin is warm and dry.  Neurological:     Mental Status: She is alert and oriented to person, place, and time.    Diabetic Foot Exam - Simple   No data filed        Assessment & Plan:   [name] was seen today for medical management of chronic issues.  Diagnoses and all orders for this visit:  Type 2 diabetes mellitus with other specified complication, without long-term current use of insulin (HCC) -     Bayer DCA Hb A1c Waived -     CMP14+EGFR -     metFORMIN (GLUCOPHAGE-XR) 500 MG 24 hr tablet; Take 2 tablets (1,000 mg total) by mouth daily with breakfast.  Hyperlipidemia associated with type 2 diabetes mellitus (HCC) -     Lipid panel  Leukocytosis, unspecified type -     CBC with Differential/Platelet  Hypertension associated with diabetes ([place]) -     CMP14+EGFR -     lisinopril (ZESTRIL) 20 MG tablet; Take 1 tablet (20 mg total) by mouth daily.  Type 2 diabetes mellitus without complication, without long-term current use of insulin (HCC) -     Semaglutide (RYBELSUS) 3 MG TABS; Take 1 tablet by mouth daily. Ntbs for further refills  Need for immunization against influenza -     Flu Vaccine QUAD 29moIM (Fluarix, Fluzone & Alfiuria Quad PF)   I have discontinued Kately L. Axford's OneTouch Verio, OneTouch Delica Plus LYBOFBP10C and [employer] I have also changed her lisinopril and metFORMIN. Additionally, I am having her maintain her levonorgestrel, cholecalciferol, hydrochlorothiazide, atorvastatin, and Rybelsus.  Meds ordered this encounter  Medications   lisinopril (ZESTRIL) 20 MG tablet    Sig: Take 1 tablet (20 mg total) by mouth daily.    Dispense:  90 tablet    Refill:  1   metFORMIN (GLUCOPHAGE-XR) 500 MG 24 hr tablet    Sig: Take 2 tablets (1,000 mg total) by mouth daily with breakfast.    Dispense:  180 tablet    Refill:  3   Semaglutide (RYBELSUS) 3 MG TABS    Sig: Take 1 tablet by mouth daily. Ntbs for further refills    Dispense:  90 tablet    Refill:  3     Follow-up: Return in about 3 months (around 05/23/2021).  WClaretta Fraise M.D.

## 2021-02-25 ENCOUNTER — Encounter: Payer: Self-pay | Admitting: Family Medicine

## 2021-02-25 LAB — IRON AND TIBC
Iron Saturation: 35 % (ref 15–55)
Iron: 102 ug/dL (ref 27–159)
Total Iron Binding Capacity: 290 ug/dL (ref 250–450)
UIBC: 188 ug/dL (ref 131–425)

## 2021-02-25 LAB — SPECIMEN STATUS REPORT

## 2021-02-25 LAB — FERRITIN: Ferritin: 389 ng/mL — ABNORMAL HIGH (ref 15–150)

## 2021-02-27 LAB — CBC WITH DIFFERENTIAL/PLATELET
Basophils Absolute: 0 10*3/uL (ref 0.0–0.2)
Basos: 0 %
EOS (ABSOLUTE): 0.1 10*3/uL (ref 0.0–0.4)
Eos: 1 %
Hematocrit: 39 % (ref 34.0–46.6)
Hemoglobin: 12.7 g/dL (ref 11.1–15.9)
Immature Grans (Abs): 0 10*3/uL (ref 0.0–0.1)
Immature Granulocytes: 0 %
Lymphocytes Absolute: 4.3 10*3/uL — ABNORMAL HIGH (ref 0.7–3.1)
Lymphs: 42 %
MCH: 24.5 pg — ABNORMAL LOW (ref 26.6–33.0)
MCHC: 32.6 g/dL (ref 31.5–35.7)
MCV: 75 fL — ABNORMAL LOW (ref 79–97)
Monocytes Absolute: 0.8 10*3/uL (ref 0.1–0.9)
Monocytes: 8 %
Neutrophils Absolute: 5 10*3/uL (ref 1.4–7.0)
Neutrophils: 49 %
Platelets: 336 10*3/uL (ref 150–450)
RBC: 5.18 x10E6/uL (ref 3.77–5.28)
RDW: 14.7 % (ref 11.7–15.4)
WBC: 10.3 10*3/uL (ref 3.4–10.8)

## 2021-02-27 LAB — LIPID PANEL
Chol/HDL Ratio: 4.6 ratio — ABNORMAL HIGH (ref 0.0–4.4)
Cholesterol, Total: 216 mg/dL — ABNORMAL HIGH (ref 100–199)
HDL: 47 mg/dL (ref >39)
LDL Chol Calc (NIH): 138 mg/dL — ABNORMAL HIGH (ref 0–99)
Triglycerides: 175 mg/dL — ABNORMAL HIGH (ref 0–149)
VLDL Cholesterol Cal: 31 mg/dL (ref 5–40)

## 2021-02-27 LAB — CMP14+EGFR
ALT: 19 IU/L (ref 0–32)
AST: 15 IU/L (ref 0–40)
Albumin/Globulin Ratio: 1.5 (ref 1.2–2.2)
Albumin: 4.4 g/dL (ref 3.8–4.8)
Alkaline Phosphatase: 71 IU/L (ref 44–121)
BUN/Creatinine Ratio: 19 (ref 9–23)
BUN: 17 mg/dL (ref 6–24)
Bilirubin Total: <0.2 mg/dL (ref 0.0–1.2)
CO2: 19 mmol/L — ABNORMAL LOW (ref 20–29)
Calcium: 9.5 mg/dL (ref 8.7–10.2)
Chloride: 106 mmol/L (ref 96–106)
Creatinine, Ser: 0.91 mg/dL (ref 0.57–1.00)
Globulin, Total: 2.9 g/dL (ref 1.5–4.5)
Glucose: 107 mg/dL — ABNORMAL HIGH (ref 70–99)
Potassium: 4.5 mmol/L (ref 3.5–5.2)
Sodium: 145 mmol/L — ABNORMAL HIGH (ref 134–144)
Total Protein: 7.3 g/dL (ref 6.0–8.5)
eGFR: 77 mL/min/{1.73_m2} (ref >59)

## 2021-05-22 ENCOUNTER — Other Ambulatory Visit: Payer: Self-pay | Admitting: Family Medicine

## 2021-05-24 ENCOUNTER — Ambulatory Visit: Payer: No Typology Code available for payment source | Admitting: Family Medicine

## 2021-07-08 ENCOUNTER — Ambulatory Visit (INDEPENDENT_AMBULATORY_CARE_PROVIDER_SITE_OTHER): Payer: No Typology Code available for payment source | Admitting: Family Medicine

## 2021-07-08 ENCOUNTER — Encounter: Payer: Self-pay | Admitting: Family Medicine

## 2021-07-08 VITALS — BP 138/84 | HR 100 | Temp 97.7°F | Ht 64.0 in | Wt 268.2 lb

## 2021-07-08 DIAGNOSIS — E1159 Type 2 diabetes mellitus with other circulatory complications: Secondary | ICD-10-CM

## 2021-07-08 DIAGNOSIS — Z23 Encounter for immunization: Secondary | ICD-10-CM | POA: Diagnosis not present

## 2021-07-08 DIAGNOSIS — E1169 Type 2 diabetes mellitus with other specified complication: Secondary | ICD-10-CM

## 2021-07-08 DIAGNOSIS — E785 Hyperlipidemia, unspecified: Secondary | ICD-10-CM

## 2021-07-08 DIAGNOSIS — I152 Hypertension secondary to endocrine disorders: Secondary | ICD-10-CM

## 2021-07-08 DIAGNOSIS — R600 Localized edema: Secondary | ICD-10-CM

## 2021-07-08 LAB — BAYER DCA HB A1C WAIVED: HB A1C (BAYER DCA - WAIVED): 7.4 % — ABNORMAL HIGH (ref 4.8–5.6)

## 2021-07-08 MED ORDER — HYDROCHLOROTHIAZIDE 25 MG PO TABS: 12.5000 mg | ORAL_TABLET | Freq: Every day | ORAL | 3 refills | Status: DC | PRN

## 2021-07-08 MED ORDER — OZEMPIC (0.25 OR 0.5 MG/DOSE) 2 MG/3ML ~~LOC~~ SOPN: PEN_INJECTOR | SUBCUTANEOUS | 1 refills | Status: DC

## 2021-07-08 MED ORDER — LISINOPRIL 20 MG PO TABS: 20.0000 mg | ORAL_TABLET | Freq: Every day | ORAL | 3 refills | Status: DC

## 2021-07-08 MED ORDER — FLUCONAZOLE 150 MG PO TABS: 150.0000 mg | ORAL_TABLET | Freq: Once | ORAL | 0 refills | Status: AC

## 2021-07-08 NOTE — Progress Notes (Signed)
Subjective:  Patient ID: [name], female    DOB: 16-May-1970  Age: 51 y.o. MRN: [id number]  CC: Medical Management of Chronic Issues   HPI [name] presents forFollow-up of diabetes. Patient checks blood sugar at home.   165-170 fasting and 200+ postprandial. Eating too much carb.  Patient denies symptoms such as polyuria, polydipsia, excessive hunger, nausea No significant hypoglycemic spells noted. Medications reviewed. Pt reports taking them regularly without complication/adverse reaction being reported today.    History [name] has a past medical history of Abnormal Pap smear (3536), Complication of anesthesia, Cystic fibrosis carrier, FHx: heart disease, FHx: hypertension, FHx: scoliosis, H/O candidiasis, H/O chlamydia infection (2006), UTI (urinary tract infection), Hyperlipidemia, Hypertension, Increased BMI, Obesity, and Vulvitis (04/27/06).   She has a past surgical history that includes Wisdom tooth extraction and Cesarean section.   Her family history includes Alcohol abuse in her maternal uncle; Diabetes in her mother and paternal grandmother; Heart disease in her mother; Hypertension in her mother and sister; Mental illness in her paternal grandmother.She reports that she has never smoked. She has never used smokeless tobacco. She reports that she does not drink alcohol and does not use drugs.  Current Outpatient Medications on File Prior to Visit  Medication Sig Dispense Refill   cholecalciferol (VITAMIN D3) 25 MCG (1000 UNIT) tablet Take 2,000 Units by mouth daily.     levonorgestrel (MIRENA) 20 MCG/24HR IUD Mirena 20 mcg/24 hours (5 yrs) 52 mg intrauterine device  Take 1 device by intrauterine route.     metFORMIN (GLUCOPHAGE-XR) 500 MG 24 hr tablet Take 2 tablets (1,000 mg total) by mouth daily with breakfast. 180 tablet 3   No current facility-administered medications on file prior to visit.    ROS Review of Systems  Constitutional: Negative.   HENT:  Negative.    Eyes:  Negative for visual disturbance.  Respiratory:  Negative for shortness of breath.   Cardiovascular:  Negative for chest pain.  Gastrointestinal:  Negative for abdominal pain.  Genitourinary:  Positive for vaginal pain (itching). Negative for frequency and vaginal discharge.  Musculoskeletal:  Negative for arthralgias.   Objective:  BP 138/84   Pulse 100   Temp 97.7 F (36.5 C)   Ht _0  (1.626 m)   Wt 268 lb 3.2 oz (121.7 kg)   SpO2 99%   BMI 46.04 kg/m   BP Readings from Last 3 Encounters:  07/08/21 138/84  02/22/21 126/73  07/22/20 132/63    Wt Readings from Last 3 Encounters:  07/08/21 268 lb 3.2 oz (121.7 kg)  02/22/21 265 lb (120.2 kg)  07/22/20 264 lb 9.6 oz (120 kg)     Physical Exam Constitutional:      General: She is not in acute distress.    Appearance: She is well-developed.  Cardiovascular:     Rate and Rhythm: Normal rate and regular rhythm.  Pulmonary:     Breath sounds: Normal breath sounds.  Musculoskeletal:        General: Normal range of motion.  Skin:    General: Skin is warm and dry.  Neurological:     Mental Status: She is alert and oriented to person, place, and time.      Assessment & Plan:   [name] was seen today for medical management of chronic issues.  Diagnoses and all orders for this visit:  Type 2 diabetes mellitus with other specified complication, without long-term current use of insulin ([place]) -     Bayer [facility]  Hb A1c Waived  Hyperlipidemia associated with type 2 diabetes mellitus (HCC) -     Lipid panel  Hypertension associated with diabetes ([place]) -     CBC with Differential/Platelet -     CMP14+EGFR -     lisinopril (ZESTRIL) 20 MG tablet; Take 1 tablet (20 mg total) by mouth daily.  Pedal edema -     hydrochlorothiazide (HYDRODIURIL) 25 MG tablet; Take 0.5-1 tablets (12.5-25 mg total) by mouth daily as needed (swelling).  Need for shingles vaccine -     Varicella-zoster vaccine IM  (Shingrix)  Other orders -     Semaglutide,0.25 or 0.5MG/DOS, (OZEMPIC, 0.25 OR 0.5 MG/DOSE,) 2 MG/3ML SOPN; Inject 0.25 mg into the skin once a week for 14 days, THEN 0.5 mg once a week. -     fluconazole (DIFLUCAN) 150 MG tablet; Take 1 tablet (150 mg total) by mouth once for 1 dose. At onset of symptoms. Repeat at end of treatment    T obesity.  He new medication semaglutide should address the uncontrolled diabetes as well as help her with weight loss due to her  I have discontinued Federica L. Babel's atorvastatin and Rybelsus. I am also having her start on Ozempic (0.25 or 0.5 MG/DOSE) and fluconazole. Additionally, I am having her maintain her levonorgestrel, cholecalciferol, metFORMIN, hydrochlorothiazide, and lisinopril.  Meds ordered this encounter  Medications   hydrochlorothiazide (HYDRODIURIL) 25 MG tablet    Sig: Take 0.5-1 tablets (12.5-25 mg total) by mouth daily as needed (swelling).    Dispense:  90 tablet    Refill:  3   lisinopril (ZESTRIL) 20 MG tablet    Sig: Take 1 tablet (20 mg total) by mouth daily.    Dispense:  90 tablet    Refill:  3   Semaglutide,0.25 or 0.5MG/DOS, (OZEMPIC, 0.25 OR 0.5 MG/DOSE,) 2 MG/3ML SOPN    Sig: Inject 0.25 mg into the skin once a week for 14 days, THEN 0.5 mg once a week.    Dispense:  9 mL    Refill:  1   fluconazole (DIFLUCAN) 150 MG tablet    Sig: Take 1 tablet (150 mg total) by mouth once for 1 dose. At onset of symptoms. Repeat at end of treatment    Dispense:  2 tablet    Refill:  0     Follow-up: Return in about 3 months (around 10/08/2021).  Claretta Fraise, M.D.

## 2021-07-09 LAB — CMP14+EGFR
ALT: 25 IU/L (ref 0–32)
AST: 18 IU/L (ref 0–40)
Albumin/Globulin Ratio: 1.7 (ref 1.2–2.2)
Albumin: 4.4 g/dL (ref 3.8–4.9)
Alkaline Phosphatase: 72 IU/L (ref 44–121)
BUN/Creatinine Ratio: 20 (ref 9–23)
BUN: 15 mg/dL (ref 6–24)
Bilirubin Total: 0.3 mg/dL (ref 0.0–1.2)
CO2: 22 mmol/L (ref 20–29)
Calcium: 9.6 mg/dL (ref 8.7–10.2)
Chloride: 101 mmol/L (ref 96–106)
Creatinine, Ser: 0.76 mg/dL (ref 0.57–1.00)
Globulin, Total: 2.6 g/dL (ref 1.5–4.5)
Glucose: 129 mg/dL — ABNORMAL HIGH (ref 70–99)
Potassium: 4 mmol/L (ref 3.5–5.2)
Sodium: 139 mmol/L (ref 134–144)
Total Protein: 7 g/dL (ref 6.0–8.5)
eGFR: 95 mL/min/{1.73_m2} (ref >59)

## 2021-07-09 LAB — LIPID PANEL
Chol/HDL Ratio: 4.6 ratio — ABNORMAL HIGH (ref 0.0–4.4)
Cholesterol, Total: 187 mg/dL (ref 100–199)
HDL: 41 mg/dL (ref >39)
LDL Chol Calc (NIH): 112 mg/dL — ABNORMAL HIGH (ref 0–99)
Triglycerides: 195 mg/dL — ABNORMAL HIGH (ref 0–149)
VLDL Cholesterol Cal: 34 mg/dL (ref 5–40)

## 2021-07-09 LAB — CBC WITH DIFFERENTIAL/PLATELET
Basophils Absolute: 0 10*3/uL (ref 0.0–0.2)
Basos: 0 %
EOS (ABSOLUTE): 0.1 10*3/uL (ref 0.0–0.4)
Eos: 1 %
Hematocrit: 38.7 % (ref 34.0–46.6)
Hemoglobin: 12.6 g/dL (ref 11.1–15.9)
Immature Grans (Abs): 0 10*3/uL (ref 0.0–0.1)
Immature Granulocytes: 0 %
Lymphocytes Absolute: 4.5 10*3/uL — ABNORMAL HIGH (ref 0.7–3.1)
Lymphs: 40 %
MCH: 24.6 pg — ABNORMAL LOW (ref 26.6–33.0)
MCHC: 32.6 g/dL (ref 31.5–35.7)
MCV: 75 fL — ABNORMAL LOW (ref 79–97)
Monocytes Absolute: 0.6 10*3/uL (ref 0.1–0.9)
Monocytes: 5 %
Neutrophils Absolute: 6.1 10*3/uL (ref 1.4–7.0)
Neutrophils: 54 %
Platelets: 338 10*3/uL (ref 150–450)
RBC: 5.13 x10E6/uL (ref 3.77–5.28)
RDW: 14.5 % (ref 11.7–15.4)
WBC: 11.4 10*3/uL — ABNORMAL HIGH (ref 3.4–10.8)

## 2021-07-20 ENCOUNTER — Other Ambulatory Visit: Payer: Self-pay | Admitting: *Deleted

## 2021-07-20 ENCOUNTER — Encounter: Payer: Self-pay | Admitting: Family Medicine

## 2021-07-20 MED ORDER — OZEMPIC (0.25 OR 0.5 MG/DOSE) 2 MG/3ML ~~LOC~~ SOPN: PEN_INJECTOR | SUBCUTANEOUS | 1 refills | Status: DC

## 2021-07-21 ENCOUNTER — Telehealth: Payer: Self-pay | Admitting: *Deleted

## 2021-07-21 NOTE — Telephone Encounter (Signed)
Message from Plan Request Reference Number: [id number]. OZEMPIC INJ 2MG /3ML is approved through 07/22/2022. Your patient may now fill this prescription and it will be covered.  [facility] aware

## 2021-07-21 NOTE — Telephone Encounter (Signed)
(  Key: [id number]) Rx #: [id number] Ozempic (0.25 or 0.5 MG/DOSE) 2MG /3ML pen-injectors   Form [employer] Electronic Prior Authorization Form (2017 NCPDP)  Sent to plan

## 2021-07-24 ENCOUNTER — Other Ambulatory Visit: Payer: Self-pay | Admitting: Family Medicine

## 2021-07-26 ENCOUNTER — Other Ambulatory Visit: Payer: Self-pay | Admitting: *Deleted

## 2021-07-26 MED ORDER — ONETOUCH VERIO VI STRP: ORAL_STRIP | 3 refills | Status: DC

## 2021-07-26 MED ORDER — ONETOUCH DELICA PLUS LANCET33G MISC: 3 refills | Status: DC

## 2021-10-12 ENCOUNTER — Encounter: Payer: Self-pay | Admitting: Family Medicine

## 2021-10-12 ENCOUNTER — Ambulatory Visit (INDEPENDENT_AMBULATORY_CARE_PROVIDER_SITE_OTHER): Payer: No Typology Code available for payment source | Admitting: Family Medicine

## 2021-10-12 VITALS — BP 125/82 | HR 90 | Temp 97.5°F | Ht 64.0 in | Wt 258.6 lb

## 2021-10-12 DIAGNOSIS — E785 Hyperlipidemia, unspecified: Secondary | ICD-10-CM | POA: Diagnosis not present

## 2021-10-12 DIAGNOSIS — I152 Hypertension secondary to endocrine disorders: Secondary | ICD-10-CM | POA: Diagnosis not present

## 2021-10-12 DIAGNOSIS — E1169 Type 2 diabetes mellitus with other specified complication: Secondary | ICD-10-CM | POA: Diagnosis not present

## 2021-10-12 DIAGNOSIS — E1159 Type 2 diabetes mellitus with other circulatory complications: Secondary | ICD-10-CM | POA: Diagnosis not present

## 2021-10-12 LAB — BAYER DCA HB A1C WAIVED: HB A1C (BAYER DCA - WAIVED): 7.7 % — ABNORMAL HIGH (ref 4.8–5.6)

## 2021-10-12 MED ORDER — ATORVASTATIN CALCIUM 20 MG PO TABS: 20.0000 mg | ORAL_TABLET | Freq: Every day | ORAL | 3 refills | Status: DC

## 2021-10-12 MED ORDER — OZEMPIC (1 MG/DOSE) 4 MG/3ML ~~LOC~~ SOPN: 1.0000 mg | PEN_INJECTOR | SUBCUTANEOUS | 3 refills | Status: DC

## 2021-10-12 NOTE — Progress Notes (Signed)
Subjective:  Patient ID: [name],  female    DOB: 1970-08-16  Age: 51 y.o.    CC: Medical Management of Chronic Issues   HPI ANELIA CARRIVEAU presents for  follow-up of hypertension. Patient has no history of headache chest pain or shortness of breath or recent cough. Patient also denies symptoms of TIA such as numbness weakness lateralizing. Patient denies side effects from medication. States taking it regularly.  Patient also  in for follow-up of elevated cholesterol. Doing well without complaints on current medication. Denies side effects  including myalgia and arthralgia and nausea. Also in today for liver function testing. Currently no chest pain, shortness of breath or other cardiovascular related symptoms noted.  Follow-up of diabetes. Patient does check blood sugar at home. Readings run between 120-150 on average.  Patient denies symptoms such as excessive hunger or urinary frequency, excessive hunger, nausea No significant hypoglycemic spells noted. Medications reviewed. Pt reports taking them regularly. Pt. denies complication/adverse reaction today.    History Valrie has a past medical history of Abnormal Pap smear (1610), Complication of anesthesia, Cystic fibrosis carrier, FHx: heart disease, FHx: hypertension, FHx: scoliosis, H/O candidiasis, H/O chlamydia infection (2006), UTI (urinary tract infection), Hyperlipidemia, Hypertension, Increased BMI, Obesity, and Vulvitis (04/27/06).   She has a past surgical history that includes Wisdom tooth extraction and Cesarean section.   Her family history includes Alcohol abuse in her maternal uncle; Diabetes in her mother and paternal grandmother; Heart disease in her mother; Hypertension in her mother and sister; Mental illness in her paternal grandmother.She reports that she has never smoked. She has never used smokeless tobacco. She reports that she does not drink alcohol and does not use drugs.  Current Outpatient Medications  on File Prior to Visit  Medication Sig Dispense Refill   cholecalciferol (VITAMIN D3) 25 MCG (1000 UNIT) tablet Take 2,000 Units by mouth daily.     glucose blood (ONETOUCH VERIO) test strip TEST BLOOD SUGAR 3 TIMES  DAILY Dx E11.9 300 strip 3   hydrochlorothiazide (HYDRODIURIL) 25 MG tablet Take 0.5-1 tablets (12.5-25 mg total) by mouth daily as needed (swelling). 90 tablet 3   Lancets (ONETOUCH DELICA PLUS [id number]) MISC TEST BLOOD SUGAR 3 TIMES  DAILY Dx E11.9 300 each 3   levonorgestrel (MIRENA) 20 MCG/24HR IUD Mirena 20 mcg/24 hours (5 yrs) 52 mg intrauterine device  Take 1 device by intrauterine route.     lisinopril (ZESTRIL) 20 MG tablet Take 1 tablet (20 mg total) by mouth daily. 90 tablet 3   metFORMIN (GLUCOPHAGE-XR) 500 MG 24 hr tablet Take 2 tablets (1,000 mg total) by mouth daily with breakfast. 180 tablet 3   No current facility-administered medications on file prior to visit.    ROS Review of Systems  Constitutional: Negative.   HENT: Negative.    Eyes:  Negative for visual disturbance.  Respiratory:  Negative for shortness of breath.   Cardiovascular:  Negative for chest pain.  Gastrointestinal:  Negative for abdominal pain.  Musculoskeletal:  Negative for arthralgias.    Objective:  BP 125/82   Pulse 90   Temp (!) 97.5 F (36.4 C)   Ht 5' 4"  (1.626 m)   Wt 258 lb 9.6 oz (117.3 kg)   SpO2 99%   BMI 44.39 kg/m   BP Readings from Last 3 Encounters:  10/12/21 125/82  07/08/21 138/84  02/22/21 126/73    Wt Readings from Last 3 Encounters:  10/12/21 258 lb 9.6 oz (117.3 kg)  07/08/21  268 lb 3.2 oz (121.7 kg)  02/22/21 265 lb (120.2 kg)     Physical Exam Constitutional:      General: She is not in acute distress.    Appearance: She is well-developed.  Cardiovascular:     Rate and Rhythm: Normal rate and regular rhythm.  Pulmonary:     Breath sounds: Normal breath sounds.  Musculoskeletal:        General: Normal range of motion.  Skin:     General: Skin is warm and dry.  Neurological:     Mental Status: She is alert and oriented to person, place, and time.     Diabetic Foot Exam - Simple   No data filed     Lab Results  Component Value Date   HGBA1C 7.4 (H) 07/08/2021   HGBA1C 6.8 (H) 02/22/2021   HGBA1C 7.0 (H) 07/22/2020    Assessment & Plan:   Kyisha was seen today for medical management of chronic issues.  Diagnoses and all orders for this visit:  Type 2 diabetes mellitus with other specified complication, without long-term current use of insulin ([place]) -     Bayer DCA Hb A1c Waived  Hyperlipidemia associated with type 2 diabetes mellitus ([place]) -     Lipid panel  Hypertension associated with diabetes ([place]) -     CBC with Differential/Platelet -     CMP14+EGFR  Other orders -     Semaglutide, 1 MG/DOSE, (OZEMPIC, 1 MG/DOSE,) 4 MG/3ML SOPN; Inject 1 mg into the skin once a week. -     atorvastatin (LIPITOR) 20 MG tablet; Take 1 tablet (20 mg total) by mouth daily. For cholesterol   I have discontinued Marykathleen L. Forry's Ozempic (0.25 or 0.5 MG/DOSE). I am also having her start on Ozempic (1 MG/DOSE) and atorvastatin. Additionally, I am having her maintain her levonorgestrel, cholecalciferol, metFORMIN, hydrochlorothiazide, lisinopril, OneTouch Delica Plus [id number], and OneTouch Verio.  Meds ordered this encounter  Medications   Semaglutide, 1 MG/DOSE, (OZEMPIC, 1 MG/DOSE,) 4 MG/3ML SOPN    Sig: Inject 1 mg into the skin once a week.    Dispense:  9 mL    Refill:  3   atorvastatin (LIPITOR) 20 MG tablet    Sig: Take 1 tablet (20 mg total) by mouth daily. For cholesterol    Dispense:  90 tablet    Refill:  3     Follow-up: Return in about 3 months (around 01/11/2022) for diabetes.  Claretta Fraise, M.D.

## 2021-10-13 ENCOUNTER — Other Ambulatory Visit: Payer: Self-pay | Admitting: Family Medicine

## 2021-10-13 LAB — CBC WITH DIFFERENTIAL/PLATELET
Basophils Absolute: 0 10*3/uL (ref 0.0–0.2)
Basos: 0 %
EOS (ABSOLUTE): 0 10*3/uL (ref 0.0–0.4)
Eos: 0 %
Hematocrit: 40.8 % (ref 34.0–46.6)
Hemoglobin: 13.1 g/dL (ref 11.1–15.9)
Immature Grans (Abs): 0 10*3/uL (ref 0.0–0.1)
Immature Granulocytes: 0 %
Lymphocytes Absolute: 3.1 10*3/uL (ref 0.7–3.1)
Lymphs: 29 %
MCH: 23.6 pg — ABNORMAL LOW (ref 26.6–33.0)
MCHC: 32.1 g/dL (ref 31.5–35.7)
MCV: 74 fL — ABNORMAL LOW (ref 79–97)
Monocytes Absolute: 0.7 10*3/uL (ref 0.1–0.9)
Monocytes: 7 %
Neutrophils Absolute: 6.7 10*3/uL (ref 1.4–7.0)
Neutrophils: 64 %
Platelets: 349 10*3/uL (ref 150–450)
RBC: 5.54 x10E6/uL — ABNORMAL HIGH (ref 3.77–5.28)
RDW: 13.4 % (ref 11.7–15.4)
WBC: 10.5 10*3/uL (ref 3.4–10.8)

## 2021-10-13 LAB — LIPID PANEL
Chol/HDL Ratio: 4.4 ratio (ref 0.0–4.4)
Cholesterol, Total: 204 mg/dL — ABNORMAL HIGH (ref 100–199)
HDL: 46 mg/dL (ref >39)
LDL Chol Calc (NIH): 129 mg/dL — ABNORMAL HIGH (ref 0–99)
Triglycerides: 162 mg/dL — ABNORMAL HIGH (ref 0–149)
VLDL Cholesterol Cal: 29 mg/dL (ref 5–40)

## 2021-10-13 LAB — CMP14+EGFR
ALT: 49 IU/L — ABNORMAL HIGH (ref 0–32)
AST: 24 IU/L (ref 0–40)
Albumin/Globulin Ratio: 1.5 (ref 1.2–2.2)
Albumin: 4.4 g/dL (ref 3.8–4.9)
Alkaline Phosphatase: 85 IU/L (ref 44–121)
BUN/Creatinine Ratio: 19 (ref 9–23)
BUN: 17 mg/dL (ref 6–24)
Bilirubin Total: 0.3 mg/dL (ref 0.0–1.2)
CO2: 22 mmol/L (ref 20–29)
Calcium: 10.1 mg/dL (ref 8.7–10.2)
Chloride: 97 mmol/L (ref 96–106)
Creatinine, Ser: 0.9 mg/dL (ref 0.57–1.00)
Globulin, Total: 3 g/dL (ref 1.5–4.5)
Glucose: 165 mg/dL — ABNORMAL HIGH (ref 70–99)
Potassium: 4.3 mmol/L (ref 3.5–5.2)
Sodium: 138 mmol/L (ref 134–144)
Total Protein: 7.4 g/dL (ref 6.0–8.5)
eGFR: 77 mL/min/{1.73_m2} (ref >59)

## 2021-10-14 ENCOUNTER — Other Ambulatory Visit: Payer: Self-pay | Admitting: Family Medicine

## 2021-10-14 MED ORDER — ATORVASTATIN CALCIUM 40 MG PO TABS: 40.0000 mg | ORAL_TABLET | Freq: Every day | ORAL | 3 refills | Status: DC

## 2021-10-15 ENCOUNTER — Encounter: Payer: Self-pay | Admitting: Family Medicine

## 2021-11-23 ENCOUNTER — Encounter: Payer: Self-pay | Admitting: Family Medicine

## 2021-11-24 ENCOUNTER — Other Ambulatory Visit: Payer: Self-pay | Admitting: *Deleted

## 2021-11-24 MED ORDER — OZEMPIC (1 MG/DOSE) 4 MG/3ML ~~LOC~~ SOPN: 1.0000 mg | PEN_INJECTOR | SUBCUTANEOUS | 3 refills | Status: DC

## 2022-01-11 ENCOUNTER — Ambulatory Visit: Payer: No Typology Code available for payment source | Admitting: Family Medicine

## 2022-04-26 ENCOUNTER — Other Ambulatory Visit: Payer: Self-pay | Admitting: Family Medicine

## 2022-04-26 DIAGNOSIS — E1169 Type 2 diabetes mellitus with other specified complication: Secondary | ICD-10-CM

## 2022-04-26 NOTE — Telephone Encounter (Signed)
Pt scheduled for CPE with [facility] 05/12/2022

## 2022-04-27 LAB — HM DIABETES EYE EXAM

## 2022-05-12 ENCOUNTER — Encounter: Payer: Self-pay | Admitting: Family Medicine

## 2022-05-12 ENCOUNTER — Ambulatory Visit (INDEPENDENT_AMBULATORY_CARE_PROVIDER_SITE_OTHER): Payer: No Typology Code available for payment source | Admitting: Family Medicine

## 2022-05-12 VITALS — BP 128/79 | HR 100 | Temp 97.6°F | Ht 64.0 in | Wt 257.2 lb

## 2022-05-12 DIAGNOSIS — Z0001 Encounter for general adult medical examination with abnormal findings: Secondary | ICD-10-CM | POA: Diagnosis not present

## 2022-05-12 DIAGNOSIS — I152 Hypertension secondary to endocrine disorders: Secondary | ICD-10-CM

## 2022-05-12 DIAGNOSIS — E1159 Type 2 diabetes mellitus with other circulatory complications: Secondary | ICD-10-CM | POA: Diagnosis not present

## 2022-05-12 DIAGNOSIS — R319 Hematuria, unspecified: Secondary | ICD-10-CM | POA: Diagnosis not present

## 2022-05-12 DIAGNOSIS — E1169 Type 2 diabetes mellitus with other specified complication: Secondary | ICD-10-CM

## 2022-05-12 DIAGNOSIS — Z0000 Encounter for general adult medical examination without abnormal findings: Secondary | ICD-10-CM

## 2022-05-12 DIAGNOSIS — E785 Hyperlipidemia, unspecified: Secondary | ICD-10-CM

## 2022-05-12 LAB — URINALYSIS
Bilirubin, UA: NEGATIVE
Glucose, UA: NEGATIVE
Ketones, UA: NEGATIVE
Leukocytes,UA: NEGATIVE
Nitrite, UA: NEGATIVE
Protein,UA: NEGATIVE
Specific Gravity, UA: 1.01 (ref 1.005–1.030)
Urobilinogen, Ur: 0.2 mg/dL (ref 0.2–1.0)
pH, UA: 6 (ref 5.0–7.5)

## 2022-05-12 LAB — URINALYSIS, MICROSCOPIC ONLY
Bacteria, UA: NONE SEEN
WBC, UA: NONE SEEN /hpf (ref 0–5)

## 2022-05-12 LAB — BAYER DCA HB A1C WAIVED: HB A1C (BAYER DCA - WAIVED): 7.3 % — ABNORMAL HIGH (ref 4.8–5.6)

## 2022-05-12 MED ORDER — SEMAGLUTIDE (2 MG/DOSE) 8 MG/3ML ~~LOC~~ SOPN: 2.0000 mg | PEN_INJECTOR | SUBCUTANEOUS | 1 refills | Status: DC

## 2022-05-12 NOTE — Progress Notes (Signed)
Subjective:  Patient ID: [name],  female    DOB: 09/09/1970  Age: 52 y.o.    CC: Annual Exam   HPI [name] presents for Annual Exam as well as  follow-up of hypertension. Patient has no history of headache chest pain or shortness of breath or recent cough. Patient also denies symptoms of TIA such as numbness weakness lateralizing. Patient denies side effects from medication. States taking it regularly.  Patient also  in for follow-up of elevated cholesterol. Doing well without complaints on current medication. Denies side effects  including myalgia and arthralgia and nausea. Also in today for liver function testing. Currently no chest pain, shortness of breath or other cardiovascular related symptoms noted.  Follow-up of diabetes. Patient does check blood sugar at home. Readings not available Patient denies symptoms such as excessive hunger or urinary frequency, excessive hunger, nausea No significant hypoglycemic spells noted. Medications reviewed. Pt reports taking them regularly. Pt. denies complication/adverse reaction today.    History [name] has a past medical history of Abnormal Pap smear ([id number]), Complication of anesthesia, Cystic fibrosis carrier, FHx: heart disease, FHx: hypertension, FHx: scoliosis, H/O candidiasis, H/O chlamydia infection (2006), UTI (urinary tract infection), Hyperlipidemia, Hypertension, Increased BMI, Obesity, and Vulvitis (04/27/06).   She has a past surgical history that includes Wisdom tooth extraction and Cesarean section.   Her family history includes Alcohol abuse in her maternal uncle; Diabetes in her mother and paternal grandmother; Heart disease in her mother; Hypertension in her mother and sister; Mental illness in her paternal grandmother.She reports that she has never smoked. She has never used smokeless tobacco. She reports that she does not drink alcohol and does not use drugs.  Current Outpatient Medications on File Prior to  Visit  Medication Sig Dispense Refill   atorvastatin (LIPITOR) 40 MG tablet Take 1 tablet (40 mg total) by mouth daily. For cholesterol 90 tablet 3   Blood Glucose Monitoring Suppl (ONETOUCH VERIO REFLECT) w/Device KIT CHECK BLOOD SUGAR 3 TIMES  DAILY AS DIRECTED 1 kit 0   cholecalciferol (VITAMIN D3) 25 MCG (1000 UNIT) tablet Take 2,000 Units by mouth daily.     glucose blood (ONETOUCH VERIO) test strip TEST BLOOD SUGAR 3 TIMES  DAILY Dx E11.9 300 strip 3   hydrochlorothiazide (HYDRODIURIL) 25 MG tablet Take 0.5-1 tablets (12.5-25 mg total) by mouth daily as needed (swelling). 90 tablet 3   Lancets (ONETOUCH DELICA PLUS [id number]) MISC TEST BLOOD SUGAR 3 TIMES  DAILY Dx E11.9 300 each 3   levonorgestrel (MIRENA) 20 MCG/24HR IUD Mirena 20 mcg/24 hours (5 yrs) 52 mg intrauterine device  Take 1 device by intrauterine route.     lisinopril (ZESTRIL) 20 MG tablet Take 1 tablet (20 mg total) by mouth daily. 90 tablet 3   metFORMIN (GLUCOPHAGE-XR) 500 MG 24 hr tablet TAKE 2 TABLETS BY MOUTH DAILY  WITH BREAKFAST 60 tablet 0   No current facility-administered medications on file prior to visit.    ROS Review of Systems  Constitutional: Negative.  Negative for appetite change, chills, diaphoresis, fatigue, fever and unexpected weight change.  HENT: Negative.  Negative for congestion, ear pain, hearing loss, postnasal drip, rhinorrhea, sneezing, sore throat and trouble swallowing.   Eyes:  Negative for pain and visual disturbance.  Respiratory:  Negative for cough, chest tightness and shortness of breath.   Cardiovascular:  Negative for chest pain and palpitations.  Gastrointestinal:  Negative for abdominal pain, constipation, diarrhea, nausea and vomiting.  Endocrine: Negative for cold  intolerance, heat intolerance, polydipsia, polyphagia and polyuria.  Genitourinary:  Negative for dysuria, frequency and menstrual problem.  Musculoskeletal:  Negative for arthralgias and joint swelling.  Skin:   Negative for rash.  Allergic/Immunologic: Negative for environmental allergies.  Neurological:  Negative for dizziness, weakness, numbness and headaches.  Psychiatric/Behavioral:  Negative for agitation and dysphoric mood.     Objective:  BP 128/79   Pulse 100   Temp 97.6 F (36.4 C)   Ht 5\' 4"  (1.626 m)   Wt 257 lb 3.2 oz (116.7 kg)   SpO2 99%   BMI 44.15 kg/m   BP Readings from Last 3 Encounters:  05/12/22 128/79  10/12/21 125/82  07/08/21 138/84    Wt Readings from Last 3 Encounters:  05/12/22 257 lb 3.2 oz (116.7 kg)  10/12/21 258 lb 9.6 oz (117.3 kg)  07/08/21 268 lb 3.2 oz (121.7 kg)     Physical Exam Constitutional:      General: She is not in acute distress.    Appearance: She is well-developed.  HENT:     Head: Normocephalic and atraumatic.  Eyes:     Conjunctiva/sclera: Conjunctivae normal.     Pupils: Pupils are equal, round, and reactive to light.  Neck:     Thyroid: No thyromegaly.  Cardiovascular:     Rate and Rhythm: Normal rate and regular rhythm.     Heart sounds: Normal heart sounds. No murmur heard. Pulmonary:     Effort: Pulmonary effort is normal. No respiratory distress.     Breath sounds: Normal breath sounds. No wheezing or rales.  Abdominal:     General: Bowel sounds are normal. There is no distension.     Palpations: Abdomen is soft.     Tenderness: There is no abdominal tenderness.  Musculoskeletal:        General: Normal range of motion.     Cervical back: Normal range of motion and neck supple.     Right lower leg: Edema (3+) present.     Left lower leg: Edema (3+) present.  Lymphadenopathy:     Cervical: No cervical adenopathy.  Skin:    General: Skin is warm and dry.  Neurological:     Mental Status: She is alert and oriented to person, place, and time.  Psychiatric:        Behavior: Behavior normal.        Thought Content: Thought content normal.        Judgment: Judgment normal.     Diabetic Foot Exam - Simple    Simple Foot Form Diabetic Foot exam was performed with the following findings: Yes 05/12/2022  2:42 PM  Visual Inspection No deformities, no ulcerations, no other skin breakdown bilaterally: Yes Sensation Testing Intact to touch and monofilament testing bilaterally: Yes Pulse Check Posterior Tibialis and Dorsalis pulse intact bilaterally: Yes Comments     Lab Results  Component Value Date   HGBA1C 7.3 (H) 05/12/2022   HGBA1C 7.7 (H) 10/12/2021   HGBA1C 7.4 (H) 07/08/2021    Assessment & Plan:   [name] was seen today for annual exam.  Diagnoses and all orders for this visit:  Type 2 diabetes mellitus with other specified complication, without long-term current use of insulin -     Bayer DCA Hb A1c Waived -     Microalbumin / creatinine urine ratio  Hyperlipidemia associated with type 2 diabetes mellitus -     Lipid panel  Hypertension associated with diabetes -     CBC with  Differential/Platelet -     CMP14+EGFR  Well adult exam -     Bayer DCA Hb A1c Waived -     CBC with Differential/Platelet -     CMP14+EGFR -     Lipid panel -     Urinalysis -     Microalbumin / creatinine urine ratio -     VITAMIN D 25 Hydroxy (Vit-D Deficiency, Fractures)  Other orders -     Semaglutide, 2 MG/DOSE, 8 MG/3ML SOPN; Inject 2 mg as directed once a week.   I have discontinued Miku L. Genova's Ozempic (1 MG/DOSE). I am also having her start on Semaglutide (2 MG/DOSE). Additionally, I am having her maintain her levonorgestrel, cholecalciferol, hydrochlorothiazide, lisinopril, OneTouch Delica Plus 0000000, OneTouch Verio, OneTouch Verio Reflect, atorvastatin, and metFORMIN.  Meds ordered this encounter  Medications   Semaglutide, 2 MG/DOSE, 8 MG/3ML SOPN    Sig: Inject 2 mg as directed once a week.    Dispense:  9 mL    Refill:  1     Follow-up: Return in about 3 months (around 08/11/2022).  Claretta Fraise, M.D.

## 2022-05-12 NOTE — Addendum Note (Signed)
Addended by: Baldomero Lamy B on: 05/12/2022 03:24 PM   Modules accepted: Orders

## 2022-05-13 LAB — CBC WITH DIFFERENTIAL/PLATELET
Basophils Absolute: 0.1 10*3/uL (ref 0.0–0.2)
Basos: 1 %
EOS (ABSOLUTE): 0.1 10*3/uL (ref 0.0–0.4)
Eos: 1 %
Hematocrit: 43.6 % (ref 34.0–46.6)
Hemoglobin: 13.4 g/dL (ref 11.1–15.9)
Immature Grans (Abs): 0 10*3/uL (ref 0.0–0.1)
Immature Granulocytes: 0 %
Lymphocytes Absolute: 4.4 10*3/uL — ABNORMAL HIGH (ref 0.7–3.1)
Lymphs: 35 %
MCH: 23.5 pg — ABNORMAL LOW (ref 26.6–33.0)
MCHC: 30.7 g/dL — ABNORMAL LOW (ref 31.5–35.7)
MCV: 76 fL — ABNORMAL LOW (ref 79–97)
Monocytes Absolute: 0.8 10*3/uL (ref 0.1–0.9)
Monocytes: 7 %
Neutrophils Absolute: 7.2 10*3/uL — ABNORMAL HIGH (ref 1.4–7.0)
Neutrophils: 56 %
Platelets: 433 10*3/uL (ref 150–450)
RBC: 5.71 x10E6/uL — ABNORMAL HIGH (ref 3.77–5.28)
RDW: 14.4 % (ref 11.7–15.4)
WBC: 12.5 10*3/uL — ABNORMAL HIGH (ref 3.4–10.8)

## 2022-05-13 LAB — LIPID PANEL
Chol/HDL Ratio: 2.6 ratio (ref 0.0–4.4)
Cholesterol, Total: 123 mg/dL (ref 100–199)
HDL: 48 mg/dL (ref >39)
LDL Chol Calc (NIH): 53 mg/dL (ref 0–99)
Triglycerides: 124 mg/dL (ref 0–149)
VLDL Cholesterol Cal: 22 mg/dL (ref 5–40)

## 2022-05-13 LAB — CMP14+EGFR
ALT: 24 IU/L (ref 0–32)
AST: 19 IU/L (ref 0–40)
Albumin/Globulin Ratio: 1.5 (ref 1.2–2.2)
Albumin: 4.6 g/dL (ref 3.8–4.9)
Alkaline Phosphatase: 85 IU/L (ref 44–121)
BUN/Creatinine Ratio: 15 (ref 9–23)
BUN: 14 mg/dL (ref 6–24)
Bilirubin Total: 0.4 mg/dL (ref 0.0–1.2)
CO2: 24 mmol/L (ref 20–29)
Calcium: 10.4 mg/dL — ABNORMAL HIGH (ref 8.7–10.2)
Chloride: 97 mmol/L (ref 96–106)
Creatinine, Ser: 0.92 mg/dL (ref 0.57–1.00)
Globulin, Total: 3 g/dL (ref 1.5–4.5)
Glucose: 117 mg/dL — ABNORMAL HIGH (ref 70–99)
Potassium: 4.4 mmol/L (ref 3.5–5.2)
Sodium: 138 mmol/L (ref 134–144)
Total Protein: 7.6 g/dL (ref 6.0–8.5)
eGFR: 75 mL/min/{1.73_m2} (ref >59)

## 2022-05-13 LAB — MICROALBUMIN / CREATININE URINE RATIO
Creatinine, Urine: 74.5 mg/dL
Microalb/Creat Ratio: 70 mg/g creat — ABNORMAL HIGH (ref 0–29)
Microalbumin, Urine: 51.9 ug/mL

## 2022-05-13 LAB — VITAMIN D 25 HYDROXY (VIT D DEFICIENCY, FRACTURES): Vit D, 25-Hydroxy: 37.8 ng/mL (ref 30.0–100.0)

## 2022-05-16 ENCOUNTER — Other Ambulatory Visit: Payer: Self-pay | Admitting: Family Medicine

## 2022-05-16 DIAGNOSIS — E1159 Type 2 diabetes mellitus with other circulatory complications: Secondary | ICD-10-CM

## 2022-05-16 MED ORDER — LISINOPRIL 30 MG PO TABS: 30.0000 mg | ORAL_TABLET | Freq: Every day | ORAL | 3 refills | Status: DC

## 2022-05-18 ENCOUNTER — Other Ambulatory Visit: Payer: Self-pay | Admitting: Family Medicine

## 2022-05-18 DIAGNOSIS — E1169 Type 2 diabetes mellitus with other specified complication: Secondary | ICD-10-CM

## 2022-06-09 LAB — HM MAMMOGRAPHY

## 2022-07-01 ENCOUNTER — Telehealth: Payer: Self-pay

## 2022-07-01 NOTE — Telephone Encounter (Signed)
Per pharmacy Ozempic requires PA.  PA started via [facility] Key: [id number]  Hgb A1C result sent with PA

## 2022-07-02 ENCOUNTER — Other Ambulatory Visit (HOSPITAL_COMMUNITY): Payer: Self-pay

## 2022-07-02 NOTE — Telephone Encounter (Signed)
Pharmacy Patient Advocate Encounter  Prior Authorization for Ozempic  has been approved by [employer]  (ins).    PA # [id number] Effective dates: 07/01/2022 through 07/02/2023  Spoke with Pharmacy to process.  Georga Bora Rx Patient Advocate [phone]

## 2022-08-04 ENCOUNTER — Other Ambulatory Visit: Payer: Self-pay | Admitting: Family Medicine

## 2022-08-04 DIAGNOSIS — E1169 Type 2 diabetes mellitus with other specified complication: Secondary | ICD-10-CM

## 2022-08-15 ENCOUNTER — Ambulatory Visit (INDEPENDENT_AMBULATORY_CARE_PROVIDER_SITE_OTHER): Payer: No Typology Code available for payment source | Admitting: Family Medicine

## 2022-08-15 ENCOUNTER — Encounter: Payer: Self-pay | Admitting: Family Medicine

## 2022-08-15 VITALS — BP 137/75 | HR 101 | Temp 97.7°F | Ht 64.0 in | Wt 252.0 lb

## 2022-08-15 DIAGNOSIS — E785 Hyperlipidemia, unspecified: Secondary | ICD-10-CM

## 2022-08-15 DIAGNOSIS — E1159 Type 2 diabetes mellitus with other circulatory complications: Secondary | ICD-10-CM | POA: Diagnosis not present

## 2022-08-15 DIAGNOSIS — I152 Hypertension secondary to endocrine disorders: Secondary | ICD-10-CM

## 2022-08-15 DIAGNOSIS — R600 Localized edema: Secondary | ICD-10-CM

## 2022-08-15 DIAGNOSIS — E1169 Type 2 diabetes mellitus with other specified complication: Secondary | ICD-10-CM

## 2022-08-15 LAB — BAYER DCA HB A1C WAIVED: HB A1C (BAYER DCA - WAIVED): 6.5 % — ABNORMAL HIGH (ref 4.8–5.6)

## 2022-08-15 MED ORDER — HYDROCHLOROTHIAZIDE 25 MG PO TABS
12.50 mg | ORAL_TABLET | Freq: Every day | ORAL | 3 refills | Status: AC | PRN
Start: 2022-08-15 — End: ?

## 2022-08-15 MED ORDER — ATORVASTATIN CALCIUM 40 MG PO TABS: 40.0000 mg | ORAL_TABLET | Freq: Every day | ORAL | 3 refills | Status: DC

## 2022-08-15 NOTE — Progress Notes (Signed)
Subjective:  Patient ID: [name], female    DOB: 02/10/1970  Age: 52 y.o. MRN: [id number]  CC: Medical Management of Chronic Issues   HPI LAREISHA FASANELLA presents forFollow-up of diabetes. Patient checks blood sugar at home.  Libre 90 day average 128 Patient denies symptoms such as polyuria, polydipsia, excessive hunger, nausea No significant hypoglycemic spells noted. Medications reviewed. Pt reports taking them regularly without complication/adverse reaction being reported today.   presents for  follow-up of hypertension. Patient has no history of headache chest pain or shortness of breath or recent cough. Patient also denies symptoms of TIA such as focal numbness or weakness. Patient denies side effects from medication. States taking it regularly.   in for follow-up of elevated cholesterol. Doing well without complaints on current medication. Denies side effects of statin including myalgia and arthralgia and nausea. Currently no chest pain, shortness of breath or other cardiovascular related symptoms noted.   History Mulki has a past medical history of Abnormal Pap smear (2007), Complication of anesthesia, Cystic fibrosis carrier, FHx: heart disease, FHx: hypertension, FHx: scoliosis, H/O candidiasis, H/O chlamydia infection (2006), UTI (urinary tract infection), Hyperlipidemia, Hypertension, Increased BMI, Obesity, and Vulvitis (04/27/06).   She has a past surgical history that includes Wisdom tooth extraction and Cesarean section.   Her family history includes Alcohol abuse in her maternal uncle; Diabetes in her mother and paternal grandmother; Heart disease in her mother; Hypertension in her mother and sister; Mental illness in her paternal grandmother.She reports that she has never smoked. She has never used smokeless tobacco. She reports that she does not drink alcohol and does not use drugs.  Current Outpatient Medications on File Prior to Visit  Medication Sig Dispense  Refill   Blood Glucose Monitoring Suppl (ONETOUCH VERIO REFLECT) w/Device KIT CHECK BLOOD SUGAR 3 TIMES  DAILY AS DIRECTED 1 kit 0   cholecalciferol (VITAMIN D3) 25 MCG (1000 UNIT) tablet Take 2,000 Units by mouth daily.     glucose blood (ONETOUCH VERIO) test strip TEST BLOOD SUGAR 3 TIMES  DAILY Dx E11.9 300 strip 3   Lancets (ONETOUCH DELICA PLUS [id number]) MISC TEST BLOOD SUGAR 3 TIMES  DAILY Dx E11.9 300 each 3   levonorgestrel (MIRENA) 20 MCG/24HR IUD Mirena 20 mcg/24 hours (5 yrs) 52 mg intrauterine device  Take 1 device by intrauterine route.     lisinopril (ZESTRIL) 30 MG tablet Take 1 tablet (30 mg total) by mouth daily. 90 tablet 3   metFORMIN (GLUCOPHAGE-XR) 500 MG 24 hr tablet TAKE 2 TABLETS BY MOUTH DAILY  WITH BREAKFAST 180 tablet 0   Semaglutide, 2 MG/DOSE, 8 MG/3ML SOPN Inject 2 mg as directed once a week. 9 mL 1   No current facility-administered medications on file prior to visit.    ROS Review of Systems  Constitutional: Negative.   HENT: Negative.    Eyes:  Negative for visual disturbance.  Respiratory:  Negative for shortness of breath.   Cardiovascular:  Positive for leg swelling. Negative for chest pain.  Gastrointestinal:  Negative for abdominal pain.  Musculoskeletal:  Negative for arthralgias.    Objective:  BP 137/75   Pulse (!) 101   Temp 97.7 F (36.5 C)   Ht 5\' 4"  (1.626 m)   Wt 252 lb (114.3 kg)   SpO2 97%   BMI 43.26 kg/m   BP Readings from Last 3 Encounters:  08/15/22 137/75  05/12/22 128/79  10/12/21 125/82    Wt Readings from Last 3 Encounters:  08/15/22 252 lb (114.3 kg)  05/12/22 257 lb 3.2 oz (116.7 kg)  10/12/21 258 lb 9.6 oz (117.3 kg)     Physical Exam Constitutional:      General: She is not in acute distress.    Appearance: She is well-developed.  Cardiovascular:     Rate and Rhythm: Normal rate and regular rhythm.  Pulmonary:     Breath sounds: Normal breath sounds.  Musculoskeletal:        General: Swelling (1+  pedal, bilat) present. Normal range of motion.  Skin:    General: Skin is warm and dry.  Neurological:     Mental Status: She is alert and oriented to person, place, and time.       Assessment & Plan:   Toniah was seen today for medical management of chronic issues.  Diagnoses and all orders for this visit:  Type 2 diabetes mellitus with other specified complication, without long-term current use of insulin ([facility]) -     Bayer DCA Hb A1c Waived  Hypertension associated with diabetes ([facility]) -     CBC with Differential/Platelet -     CMP14+EGFR  Hyperlipidemia associated with type 2 diabetes mellitus ([facility]) -     Lipid panel  Pedal edema -     hydrochlorothiazide (HYDRODIURIL) 25 MG tablet; Take 0.5-1 tablets (12.5-25 mg total) by mouth daily as needed (swelling).  Other orders -     atorvastatin (LIPITOR) 40 MG tablet; Take 1 tablet (40 mg total) by mouth daily. For cholesterol      I am having Brandyn L. Barreras maintain her levonorgestrel, cholecalciferol, OneTouch Delica Plus [id number], OneTouch Verio, OneTouch Verio Reflect, Semaglutide (2 MG/DOSE), lisinopril, metFORMIN, atorvastatin, and hydrochlorothiazide.  Meds ordered this encounter  Medications   atorvastatin (LIPITOR) 40 MG tablet    Sig: Take 1 tablet (40 mg total) by mouth daily. For cholesterol    Dispense:  90 tablet    Refill:  3   hydrochlorothiazide (HYDRODIURIL) 25 MG tablet    Sig: Take 0.5-1 tablets (12.5-25 mg total) by mouth daily as needed (swelling).    Dispense:  90 tablet    Refill:  3     Follow-up: Return in about 3 months (around 11/15/2022).  Mechele Claude, M.D.

## 2022-08-16 ENCOUNTER — Other Ambulatory Visit: Payer: Self-pay | Admitting: *Deleted

## 2022-08-16 LAB — CBC WITH DIFFERENTIAL/PLATELET
Basophils Absolute: 0 10*3/uL (ref 0.0–0.2)
Basos: 0 %
EOS (ABSOLUTE): 0.1 10*3/uL (ref 0.0–0.4)
Eos: 1 %
Hematocrit: 38.7 % (ref 34.0–46.6)
Hemoglobin: 12.7 g/dL (ref 11.1–15.9)
Immature Grans (Abs): 0 10*3/uL (ref 0.0–0.1)
Immature Granulocytes: 0 %
Lymphocytes Absolute: 3.3 10*3/uL — ABNORMAL HIGH (ref 0.7–3.1)
Lymphs: 35 %
MCH: 24.9 pg — ABNORMAL LOW (ref 26.6–33.0)
MCHC: 32.8 g/dL (ref 31.5–35.7)
MCV: 76 fL — ABNORMAL LOW (ref 79–97)
Monocytes Absolute: 0.7 10*3/uL (ref 0.1–0.9)
Monocytes: 7 %
Neutrophils Absolute: 5.3 10*3/uL (ref 1.4–7.0)
Neutrophils: 57 %
Platelets: 350 10*3/uL (ref 150–450)
RBC: 5.1 x10E6/uL (ref 3.77–5.28)
RDW: 14.7 % (ref 11.7–15.4)
WBC: 9.4 10*3/uL (ref 3.4–10.8)

## 2022-08-16 LAB — CMP14+EGFR
ALT: 37 IU/L — ABNORMAL HIGH (ref 0–32)
AST: 26 IU/L (ref 0–40)
Albumin: 4.3 g/dL (ref 3.8–4.9)
Alkaline Phosphatase: 87 IU/L (ref 44–121)
BUN/Creatinine Ratio: 15 (ref 9–23)
BUN: 13 mg/dL (ref 6–24)
Bilirubin Total: 0.4 mg/dL (ref 0.0–1.2)
CO2: 23 mmol/L (ref 20–29)
Calcium: 9.5 mg/dL (ref 8.7–10.2)
Chloride: 103 mmol/L (ref 96–106)
Creatinine, Ser: 0.84 mg/dL (ref 0.57–1.00)
Globulin, Total: 2.7 g/dL (ref 1.5–4.5)
Glucose: 111 mg/dL — ABNORMAL HIGH (ref 70–99)
Potassium: 4.2 mmol/L (ref 3.5–5.2)
Sodium: 139 mmol/L (ref 134–144)
Total Protein: 7 g/dL (ref 6.0–8.5)
eGFR: 84 mL/min/{1.73_m2} (ref >59)

## 2022-08-16 LAB — LIPID PANEL
Chol/HDL Ratio: 3.2 ratio (ref 0.0–4.4)
Cholesterol, Total: 148 mg/dL (ref 100–199)
HDL: 46 mg/dL (ref >39)
LDL Chol Calc (NIH): 82 mg/dL (ref 0–99)
Triglycerides: 109 mg/dL (ref 0–149)
VLDL Cholesterol Cal: 20 mg/dL (ref 5–40)

## 2022-08-17 LAB — FERRITIN

## 2022-08-17 LAB — IRON AND TIBC

## 2022-08-18 LAB — IRON AND TIBC
Total Iron Binding Capacity: 296 ug/dL (ref 250–450)
UIBC: 216 ug/dL (ref 131–425)

## 2022-08-18 LAB — SPECIMEN STATUS REPORT

## 2022-08-18 NOTE — Progress Notes (Signed)
Hello  Judianne,    Your lab result is normal and/or stable.Some minor variations that are not significant are commonly marked abnormal, but do not represent any medical problem for you.   Best regards,  Elaya Droege, M.D.

## 2022-10-14 ENCOUNTER — Other Ambulatory Visit: Payer: Self-pay | Admitting: Family Medicine

## 2022-10-14 DIAGNOSIS — E1169 Type 2 diabetes mellitus with other specified complication: Secondary | ICD-10-CM

## 2022-10-25 ENCOUNTER — Encounter: Payer: Self-pay | Admitting: Family Medicine

## 2022-11-15 ENCOUNTER — Ambulatory Visit: Payer: No Typology Code available for payment source | Admitting: Family Medicine

## 2022-11-30 ENCOUNTER — Encounter: Payer: Self-pay | Admitting: Family Medicine

## 2022-12-12 ENCOUNTER — Other Ambulatory Visit: Payer: Self-pay | Admitting: Family Medicine

## 2022-12-20 ENCOUNTER — Other Ambulatory Visit: Payer: Self-pay | Admitting: Family Medicine

## 2022-12-20 DIAGNOSIS — E1169 Type 2 diabetes mellitus with other specified complication: Secondary | ICD-10-CM

## 2022-12-21 NOTE — Telephone Encounter (Signed)
Appt scheduled for 01/19/2023, pt states she has enough medication until appt and no need to send in a refill

## 2022-12-21 NOTE — Telephone Encounter (Signed)
[facility] NTBS for 3 mos FU, NO RF sent to mail order pharmacy

## 2023-01-19 ENCOUNTER — Ambulatory Visit: Payer: No Typology Code available for payment source | Admitting: Family Medicine

## 2023-02-06 ENCOUNTER — Ambulatory Visit: Payer: No Typology Code available for payment source | Admitting: Family Medicine

## 2023-03-01 ENCOUNTER — Encounter: Payer: Self-pay | Admitting: Family Medicine

## 2023-03-01 ENCOUNTER — Ambulatory Visit (INDEPENDENT_AMBULATORY_CARE_PROVIDER_SITE_OTHER): Payer: No Typology Code available for payment source | Admitting: Family Medicine

## 2023-03-01 VITALS — BP 134/87 | HR 84 | Temp 97.4°F | Ht 64.8 in | Wt 266.0 lb

## 2023-03-01 DIAGNOSIS — Z7984 Long term (current) use of oral hypoglycemic drugs: Secondary | ICD-10-CM

## 2023-03-01 DIAGNOSIS — E785 Hyperlipidemia, unspecified: Secondary | ICD-10-CM | POA: Diagnosis not present

## 2023-03-01 DIAGNOSIS — E1159 Type 2 diabetes mellitus with other circulatory complications: Secondary | ICD-10-CM

## 2023-03-01 DIAGNOSIS — I152 Hypertension secondary to endocrine disorders: Secondary | ICD-10-CM

## 2023-03-01 DIAGNOSIS — E1169 Type 2 diabetes mellitus with other specified complication: Secondary | ICD-10-CM

## 2023-03-01 LAB — LIPID PANEL

## 2023-03-01 LAB — BAYER DCA HB A1C WAIVED: HB A1C (BAYER DCA - WAIVED): 7.3 % — ABNORMAL HIGH (ref 4.8–5.6)

## 2023-03-01 MED ORDER — TIRZEPATIDE 10 MG/0.5ML ~~LOC~~ SOAJ
10.0000 mg | SUBCUTANEOUS | 1 refills | Status: DC
Start: 2023-03-01 — End: 2023-03-20

## 2023-03-01 MED ORDER — METFORMIN HCL ER 500 MG PO TB24
1000.0000 mg | ORAL_TABLET | Freq: Every day | ORAL | 3 refills | Status: AC
Start: 2023-03-01 — End: ?

## 2023-03-01 MED ORDER — ATORVASTATIN CALCIUM 40 MG PO TABS: 40.0000 mg | ORAL_TABLET | Freq: Every day | ORAL | 3 refills | Status: AC

## 2023-03-01 MED ORDER — LISINOPRIL 30 MG PO TABS
30.0000 mg | ORAL_TABLET | Freq: Every day | ORAL | 3 refills | Status: DC
Start: 2023-03-01 — End: 2023-03-20

## 2023-03-01 MED ORDER — OZEMPIC (2 MG/DOSE) 8 MG/3ML ~~LOC~~ SOPN: 2.0000 mg | PEN_INJECTOR | SUBCUTANEOUS | 0 refills | Status: DC

## 2023-03-01 NOTE — Progress Notes (Signed)
Subjective:  Patient ID: [name], female    DOB: December 07, 1970  Age: 53 y.o. MRN: [id number]  CC: Medication Refill (Medications are pended./Ozempic not helping with weight.)   HPI [name] presents forFollow-up of diabetes. Patient checks blood sugar at home.   150s random. Avg 165 last 90 days. Off diet and exercise regimen Patient denies symptoms such as polyuria, polydipsia, excessive hunger, nausea No significant hypoglycemic spells noted.150s random Medications reviewed. Pt reports taking them irregularly.   History [name] has a past medical history of Abnormal Pap smear (2007), Complication of anesthesia, Cystic fibrosis carrier, FHx: heart disease, FHx: hypertension, FHx: scoliosis, H/O candidiasis, H/O chlamydia infection (2006), UTI (urinary tract infection), Hyperlipidemia, Hypertension, Increased BMI, Obesity, and Vulvitis (04/27/06).   She has a past surgical history that includes Wisdom tooth extraction and Cesarean section.   Her family history includes Alcohol abuse in her maternal uncle; Diabetes in her mother and paternal grandmother; Heart disease in her mother; Hypertension in her mother and sister; Mental illness in her paternal grandmother.She reports that she has never smoked. She has never used smokeless tobacco. She reports that she does not drink alcohol and does not use drugs.  Current Outpatient Medications on File Prior to Visit  Medication Sig Dispense Refill   Blood Glucose Monitoring Suppl (ONETOUCH VERIO REFLECT) w/Device KIT CHECK BLOOD SUGAR 3 TIMES  DAILY AS DIRECTED 1 kit 0   cholecalciferol (VITAMIN D3) 25 MCG (1000 UNIT) tablet Take 2,000 Units by mouth daily.     glucose blood (ONETOUCH VERIO) test strip TEST BLOOD SUGAR 3 TIMES  DAILY Dx E11.9 300 strip 3   hydrochlorothiazide (HYDRODIURIL) 25 MG tablet Take 0.5-1 tablets (12.5-25 mg total) by mouth daily as needed (swelling). 90 tablet 3   Lancets (ONETOUCH DELICA PLUS LANCET33G) MISC TEST  BLOOD SUGAR 3 TIMES  DAILY Dx E11.9 300 each 3   levonorgestrel (MIRENA) 20 MCG/24HR IUD Mirena 20 mcg/24 hours (5 yrs) 52 mg intrauterine device  Take 1 device by intrauterine route.     No current facility-administered medications on file prior to visit.    ROS Review of Systems  Constitutional: Negative.   HENT: Negative.    Eyes:  Negative for visual disturbance.  Respiratory:  Negative for shortness of breath.   Cardiovascular:  Negative for chest pain.  Gastrointestinal:  Negative for abdominal pain.  Musculoskeletal:  Negative for arthralgias.    Objective:  There were no vitals taken for this visit.  BP Readings from Last 3 Encounters:  08/15/22 137/75  05/12/22 128/79  10/12/21 125/82    Wt Readings from Last 3 Encounters:  08/15/22 252 lb (114.3 kg)  05/12/22 257 lb 3.2 oz (116.7 kg)  10/12/21 258 lb 9.6 oz (117.3 kg)     Physical Exam Constitutional:      General: She is not in acute distress.    Appearance: She is well-developed. She is obese.  Cardiovascular:     Rate and Rhythm: Normal rate and regular rhythm.  Pulmonary:     Breath sounds: Normal breath sounds.  Musculoskeletal:        General: Normal range of motion.  Skin:    General: Skin is warm and dry.  Neurological:     Mental Status: She is alert and oriented to person, place, and time.       Assessment & Plan:   [name] was seen today for medication refill.  Diagnoses and all orders for this visit:  Hyperlipidemia associated with  type 2 diabetes mellitus (HCC) -     CBC with Differential/Platelet -     Lipid panel -     Ferritin -     CMP14+EGFR  Hypertension associated with diabetes (HCC) -     lisinopril (ZESTRIL) 30 MG tablet; Take 1 tablet (30 mg total) by mouth daily. -     CBC with Differential/Platelet -     Lipid panel -     Ferritin -     CMP14+EGFR  Type 2 diabetes mellitus with other specified complication, without long-term current use of insulin (HCC) -      metFORMIN (GLUCOPHAGE-XR) 500 MG 24 hr tablet; Take 2 tablets (1,000 mg total) by mouth daily with breakfast. -     Bayer DCA Hb A1c Waived -     CBC with Differential/Platelet -     Lipid panel -     Ferritin -     CMP14+EGFR  Other orders -     Semaglutide, 2 MG/DOSE, (OZEMPIC, 2 MG/DOSE,) 8 MG/3ML SOPN; Inject 2 mg into the skin once a week. -     atorvastatin (LIPITOR) 40 MG tablet; Take 1 tablet (40 mg total) by mouth daily. For cholesterol -     tirzepatide (MOUNJARO) 10 MG/0.5ML Pen; Inject 10 mg into the skin once a week.      I have changed [name]'s metFORMIN and Ozempic (2 MG/DOSE). I am also having her start on tirzepatide. Additionally, I am having her maintain her levonorgestrel, cholecalciferol, OneTouch Delica Plus Lancet33G, OneTouch Verio, OneTouch Verio Reflect, hydrochlorothiazide, lisinopril, and atorvastatin.  Meds ordered this encounter  Medications   lisinopril (ZESTRIL) 30 MG tablet    Sig: Take 1 tablet (30 mg total) by mouth daily.    Dispense:  90 tablet    Refill:  3   metFORMIN (GLUCOPHAGE-XR) 500 MG 24 hr tablet    Sig: Take 2 tablets (1,000 mg total) by mouth daily with breakfast.    Dispense:  180 tablet    Refill:  3    Please send a replace/new response with 90-Day Supply if appropriate to maximize member benefit. Requesting 1 year supply.   Semaglutide, 2 MG/DOSE, (OZEMPIC, 2 MG/DOSE,) 8 MG/3ML SOPN    Sig: Inject 2 mg into the skin once a week.    Dispense:  3 mL    Refill:  0    Requesting 1 year supply   atorvastatin (LIPITOR) 40 MG tablet    Sig: Take 1 tablet (40 mg total) by mouth daily. For cholesterol    Dispense:  90 tablet    Refill:  3   tirzepatide (MOUNJARO) 10 MG/0.5ML Pen    Sig: Inject 10 mg into the skin once a week.    Dispense:  2 mL    Refill:  1     Follow-up: Return in about 1 month (around 04/01/2023) for Compete physical, diabetes.  Mechele Claude, M.D.

## 2023-03-02 ENCOUNTER — Encounter: Payer: Self-pay | Admitting: Family Medicine

## 2023-03-02 LAB — LIPID PANEL
Cholesterol, Total: 231 mg/dL — ABNORMAL HIGH (ref 100–199)
HDL: 55 mg/dL (ref >39)
LDL CALC COMMENT:: 4.2 ratio (ref 0.0–4.4)
LDL Chol Calc (NIH): 141 mg/dL — ABNORMAL HIGH (ref 0–99)
Triglycerides: 195 mg/dL — ABNORMAL HIGH (ref 0–149)
VLDL Cholesterol Cal: 35 mg/dL (ref 5–40)

## 2023-03-02 LAB — FERRITIN: Ferritin: 437 ng/mL — ABNORMAL HIGH (ref 15–150)

## 2023-03-02 LAB — CBC WITH DIFFERENTIAL/PLATELET
Basophils Absolute: 0 10*3/uL (ref 0.0–0.2)
Basos: 0 %
EOS (ABSOLUTE): 0.1 10*3/uL (ref 0.0–0.4)
Eos: 0 %
Hematocrit: 40.5 % (ref 34.0–46.6)
Hemoglobin: 13.1 g/dL (ref 11.1–15.9)
Immature Grans (Abs): 0 10*3/uL (ref 0.0–0.1)
Immature Granulocytes: 0 %
Lymphocytes Absolute: 3.1 10*3/uL (ref 0.7–3.1)
Lymphs: 27 %
MCH: 24.9 pg — ABNORMAL LOW (ref 26.6–33.0)
MCHC: 32.3 g/dL (ref 31.5–35.7)
MCV: 77 fL — ABNORMAL LOW (ref 79–97)
Monocytes Absolute: 0.8 10*3/uL (ref 0.1–0.9)
Monocytes: 7 %
Neutrophils Absolute: 7.4 10*3/uL — ABNORMAL HIGH (ref 1.4–7.0)
Neutrophils: 66 %
Platelets: 344 10*3/uL (ref 150–450)
RBC: 5.26 x10E6/uL (ref 3.77–5.28)
RDW: 13.8 % (ref 11.7–15.4)
WBC: 11.3 10*3/uL — ABNORMAL HIGH (ref 3.4–10.8)

## 2023-03-02 LAB — CMP14+EGFR
ALT: 41 IU/L — ABNORMAL HIGH (ref 0–32)
AST: 28 IU/L (ref 0–40)
Albumin: 4.1 g/dL (ref 3.8–4.9)
Alkaline Phosphatase: 86 IU/L (ref 44–121)
BUN/Creatinine Ratio: 11 (ref 9–23)
BUN: 11 mg/dL (ref 6–24)
Bilirubin Total: 0.4 mg/dL (ref 0.0–1.2)
CO2: 21 mmol/L (ref 20–29)
Calcium: 9.5 mg/dL (ref 8.7–10.2)
Chloride: 100 mmol/L (ref 96–106)
Creatinine, Ser: 0.98 mg/dL (ref 0.57–1.00)
Globulin, Total: 2.8 g/dL (ref 1.5–4.5)
Glucose: 123 mg/dL — ABNORMAL HIGH (ref 70–99)
Potassium: 4.3 mmol/L (ref 3.5–5.2)
Sodium: 140 mmol/L (ref 134–144)
Total Protein: 6.9 g/dL (ref 6.0–8.5)
eGFR: 69 mL/min/{1.73_m2} (ref >59)

## 2023-03-11 ENCOUNTER — Encounter (HOSPITAL_COMMUNITY): Payer: Self-pay | Admitting: *Deleted

## 2023-03-11 ENCOUNTER — Other Ambulatory Visit: Payer: Self-pay

## 2023-03-11 ENCOUNTER — Emergency Department (HOSPITAL_COMMUNITY)
Admission: EM | Admit: 2023-03-11 | Discharge: 2023-03-11 | Disposition: A | Discharge status: Home / Self Care | Payer: No Typology Code available for payment source | Attending: Student | Admitting: Student

## 2023-03-11 ENCOUNTER — Emergency Department (HOSPITAL_COMMUNITY): Payer: No Typology Code available for payment source

## 2023-03-11 DIAGNOSIS — R1013 Epigastric pain: Secondary | ICD-10-CM | POA: Diagnosis not present

## 2023-03-11 DIAGNOSIS — E119 Type 2 diabetes mellitus without complications: Secondary | ICD-10-CM | POA: Diagnosis not present

## 2023-03-11 DIAGNOSIS — Z79899 Other long term (current) drug therapy: Secondary | ICD-10-CM | POA: Insufficient documentation

## 2023-03-11 DIAGNOSIS — Z7984 Long term (current) use of oral hypoglycemic drugs: Secondary | ICD-10-CM | POA: Diagnosis not present

## 2023-03-11 DIAGNOSIS — J111 Influenza due to unidentified influenza virus with other respiratory manifestations: Secondary | ICD-10-CM | POA: Insufficient documentation

## 2023-03-11 DIAGNOSIS — I10 Essential (primary) hypertension: Secondary | ICD-10-CM | POA: Insufficient documentation

## 2023-03-11 DIAGNOSIS — R112 Nausea with vomiting, unspecified: Secondary | ICD-10-CM | POA: Diagnosis present

## 2023-03-11 LAB — CBG MONITORING, ED: Glucose-Capillary: 182 mg/dL — ABNORMAL HIGH (ref 70–99)

## 2023-03-11 LAB — COMPREHENSIVE METABOLIC PANEL
ALT: 36 U/L (ref 0–44)
AST: 34 U/L (ref 15–41)
Albumin: 3.8 g/dL (ref 3.5–5.0)
Alkaline Phosphatase: 81 U/L (ref 38–126)
Anion gap: 13 (ref 5–15)
BUN: 14 mg/dL (ref 6–20)
CO2: 22 mmol/L (ref 22–32)
Calcium: 9 mg/dL (ref 8.9–10.3)
Chloride: 103 mmol/L (ref 98–111)
Creatinine, Ser: 0.9 mg/dL (ref 0.44–1.00)
GFR, Estimated: >60 mL/min (ref >60)
Glucose, Bld: 216 mg/dL — ABNORMAL HIGH (ref 70–99)
Potassium: 3.8 mmol/L (ref 3.5–5.1)
Sodium: 138 mmol/L (ref 135–145)
Total Bilirubin: 0.7 mg/dL (ref 0.0–1.2)
Total Protein: 7.6 g/dL (ref 6.5–8.1)

## 2023-03-11 LAB — URINALYSIS, MICROSCOPIC (REFLEX)

## 2023-03-11 LAB — CBC WITH DIFFERENTIAL/PLATELET
Abs Immature Granulocytes: 0.02 10*3/uL (ref 0.00–0.07)
Basophils Absolute: 0 10*3/uL (ref 0.0–0.1)
Basophils Relative: 0 %
Eosinophils Absolute: 0 10*3/uL (ref 0.0–0.5)
Eosinophils Relative: 0 %
HCT: 41.8 % (ref 36.0–46.0)
Hemoglobin: 13.6 g/dL (ref 12.0–15.0)
Immature Granulocytes: 0 %
Lymphocytes Relative: 18 %
Lymphs Abs: 1.1 10*3/uL (ref 0.7–4.0)
MCH: 24.4 pg — ABNORMAL LOW (ref 26.0–34.0)
MCHC: 32.5 g/dL (ref 30.0–36.0)
MCV: 74.9 fL — ABNORMAL LOW (ref 80.0–100.0)
Monocytes Absolute: 0.3 10*3/uL (ref 0.1–1.0)
Monocytes Relative: 4 %
Neutro Abs: 4.9 10*3/uL (ref 1.7–7.7)
Neutrophils Relative %: 78 %
Platelets: 344 10*3/uL (ref 150–400)
RBC: 5.58 MIL/uL — ABNORMAL HIGH (ref 3.87–5.11)
RDW: 13.9 % (ref 11.5–15.5)
WBC: 6.4 10*3/uL (ref 4.0–10.5)
nRBC: 0 % (ref 0.0–0.2)

## 2023-03-11 LAB — URINALYSIS, ROUTINE W REFLEX MICROSCOPIC
Bilirubin Urine: NEGATIVE
Glucose, UA: 250 mg/dL — AB
Ketones, ur: >80 mg/dL — AB
Leukocytes,Ua: NEGATIVE
Nitrite: NEGATIVE
Protein, ur: >300 mg/dL — AB
Specific Gravity, Urine: >1.03 — ABNORMAL HIGH (ref 1.005–1.030)
pH: 6 (ref 5.0–8.0)

## 2023-03-11 LAB — HCG, SERUM, QUALITATIVE: Preg, Serum: NEGATIVE

## 2023-03-11 LAB — LIPASE, BLOOD: Lipase: 44 U/L (ref 11–51)

## 2023-03-11 MED ORDER — ONDANSETRON 4 MG PO TBDP
4.0000 mg | ORAL_TABLET | Freq: Once | ORAL | Status: AC
  Administered 2023-03-11: 4 mg via ORAL
  Filled 2023-03-11: qty 1

## 2023-03-11 MED ORDER — IOHEXOL 350 MG/ML SOLN
75.0000 mL | Freq: Once | INTRAVENOUS | Status: AC | PRN
  Administered 2023-03-11: 75 mL via INTRAVENOUS

## 2023-03-11 MED ORDER — ONDANSETRON HCL 4 MG/2ML IJ SOLN
4.0000 mg | Freq: Once | INTRAMUSCULAR | Status: AC
  Administered 2023-03-11: 4 mg via INTRAVENOUS
  Filled 2023-03-11: qty 2

## 2023-03-11 MED ORDER — SODIUM CHLORIDE 0.9 % IV BOLUS: 1000.0000 mL | Freq: Once | INTRAVENOUS | Status: DC

## 2023-03-11 MED ORDER — MORPHINE SULFATE (PF) 2 MG/ML IV SOLN
4.0000 mg | Freq: Once | INTRAVENOUS | Status: AC
  Administered 2023-03-11: 4 mg via INTRAVENOUS
  Filled 2023-03-11: qty 2

## 2023-03-11 MED ORDER — HYDROMORPHONE HCL 1 MG/ML IJ SOLN
1.0000 mg | Freq: Once | INTRAMUSCULAR | Status: AC
  Administered 2023-03-11: 1 mg via INTRAVENOUS
  Filled 2023-03-11: qty 1

## 2023-03-11 MED ORDER — PROMETHAZINE HCL 25 MG PO TABS: 12.5000 mg | ORAL_TABLET | Freq: Four times a day (QID) | ORAL | 0 refills | Status: DC | PRN

## 2023-03-11 MED ORDER — SODIUM CHLORIDE 0.9 % IV BOLUS
1000.0000 mL | Freq: Once | INTRAVENOUS | Status: AC
  Administered 2023-03-11: 1000 mL via INTRAVENOUS

## 2023-03-11 MED ORDER — HYDROCODONE-ACETAMINOPHEN 5-325 MG PO TABS
1.0000 | ORAL_TABLET | Freq: Once | ORAL | Status: AC
  Administered 2023-03-11: 1 via ORAL
  Filled 2023-03-11: qty 1

## 2023-03-11 NOTE — ED Notes (Signed)
Pt just taken to c-t then back to hw 12

## 2023-03-11 NOTE — ED Triage Notes (Signed)
Vomiting in triage unable to hold anything down at present and her abd pain is severe

## 2023-03-11 NOTE — ED Provider Triage Note (Signed)
Emergency Medicine Provider Triage Evaluation Note  [name] , a 53 y.o. female  was evaluated in triage.  Pt complains of abdominal pain, nausea, vomiting, and diarrhea.  States on Tuesday she accidentally took 2 doses of her Mounjaro.  Symptoms began today.  Went to urgent care was diagnosed with flu, sent here for evaluation.  Pain is epigastric.  History of C-section, no other history of abdominal surgeries.  Review of Systems  Positive:  Negative:   Physical Exam  BP 127/88   Pulse 80   Temp 98.3 F (36.8 C)   Resp 16   Ht 5\' 4"  (1.626 m)   Wt 120.7 kg   SpO2 100%   BMI 45.68 kg/m  Gen:   Awake, no distress   Resp:  Normal effort  MSK:   Moves extremities without difficulty  Other:    Medical Decision Making  Medically screening exam initiated at 6:46 PM.  Appropriate orders placed.  [name] was informed that the remainder of the evaluation will be completed by another provider, this initial triage assessment does not replace that evaluation, and the importance of remaining in the ED until their evaluation is complete.     Silva Bandy, PA-C 03/11/23 [phone]

## 2023-03-11 NOTE — ED Notes (Signed)
PO challenge started with ice water and saltine crackers.

## 2023-03-11 NOTE — ED Triage Notes (Signed)
The pt has abd pain since this am  she was seen in  an urgent care  she had a flu test which was positive  n v and diarrhea after eating some greasy food  lmp   bc  she also took  some manjaro  on Tuesday and she felt bad afterward

## 2023-03-11 NOTE — Discharge Instructions (Signed)
Your workup today was reassuring.  I would make sure to have a clear liquid diet over the next 24 hours and gradually increase your diet I written you for an additional medication to help with your symptoms.  Make sure to follow-up outpatient, return for new or worsening symptoms

## 2023-03-11 NOTE — ED Notes (Signed)
PO challenge passed. Denies nausea/vomiting.

## 2023-03-11 NOTE — ED Provider Notes (Signed)
[place] EMERGENCY DEPARTMENT AT [facility] Provider Note   CSN: [id number] Arrival date & time: 03/11/23  1741    History  Chief Complaint  Patient presents with   Abdominal Pain    TARREN Blair is a 53 y.o. female with hx of HTN, DM here for evaluation of abd pain, N/V/D.  Symptoms started on Wednesday.  She thought initially this was due to accidentally taking 2 doses of her Mounjaro.  Had some diarrhea, subsequently resolved.  Today developed nausea and vomiting.  She thought this was due to eating greasy food for breakfast.  She was seen by urgent care who gave her Zofran, she also tested positive for influenza.  Her blood sugar was elevated there she subsequently was sent here for further evaluation.  She has had some chills, cough.  No shortness of breath.  No pain or swelling to lower legs.  She feels generally weak.  Has been taking Tylenol and ibuprofen for her flulike symptoms.  She does not take chronic NSAIDs.  No bloody stool or blood in her emesis.  No chronic EtOH use.  HPI     Home Medications Prior to Admission medications   Medication Sig Start Date End Date Taking? Authorizing Provider  promethazine (PHENERGAN) 25 MG tablet Take 0.5 tablets (12.5 mg total) by mouth every 6 (six) hours as needed for nausea or vomiting. 03/11/23  Yes Kaleb Sek A, PA-C  atorvastatin (LIPITOR) 40 MG tablet Take 1 tablet (40 mg total) by mouth daily. For cholesterol 03/01/23   Mechele Claude, MD  Blood Glucose Monitoring Suppl (ONETOUCH VERIO REFLECT) w/Device KIT CHECK BLOOD SUGAR 3 TIMES  DAILY AS DIRECTED 10/13/21   Mechele Claude, MD  cholecalciferol (VITAMIN D3) 25 MCG (1000 UNIT) tablet Take 2,000 Units by mouth daily.    [provider]  glucose blood (ONETOUCH VERIO) test strip TEST BLOOD SUGAR 3 TIMES  DAILY Dx E11.9 07/26/21   Mechele Claude, MD  hydrochlorothiazide (HYDRODIURIL) 25 MG tablet Take 0.5-1 tablets (12.5-25 mg total) by mouth daily as needed  (swelling). 08/15/22   Mechele Claude, MD  Lancets [facility] DELICA PLUS [place] [facility]ock) MISC TEST BLOOD SUGAR 3 TIMES  DAILY Dx E11.9 07/26/21   Mechele Claude, MD  levonorgestrel (MIRENA) 20 MCG/24HR IUD Mirena 20 mcg/24 hours (5 yrs) 52 mg intrauterine device  Take 1 device by intrauterine route.    [provider]  lisinopril (ZESTRIL) 30 MG tablet Take 1 tablet (30 mg total) by mouth daily. 03/01/23   Mechele Claude, MD  metFORMIN (GLUCOPHAGE-XR) 500 MG 24 hr tablet Take 2 tablets (1,000 mg total) by mouth daily with breakfast. 03/01/23   Mechele Claude, MD  Semaglutide, 2 MG/DOSE, (OZEMPIC, 2 MG/DOSE,) 8 MG/3ML SOPN Inject 2 mg into the skin once a week. 03/01/23   Mechele Claude, MD  tirzepatide [facility]) 10 MG/0.5ML Pen Inject 10 mg into the skin once a week. 03/01/23   Mechele Claude, MD      Allergies    Percocet [oxycodone-acetaminophen]    Review of Systems   Review of Systems  Constitutional:  Positive for activity change, appetite change, chills and fatigue.  HENT:  Positive for congestion. Negative for sore throat.   Respiratory:  Positive for cough.   Cardiovascular: Negative.   Gastrointestinal:  Positive for abdominal pain, diarrhea, nausea and vomiting. Negative for abdominal distention, anal bleeding, blood in stool, constipation and rectal pain.  Genitourinary: Negative.   Musculoskeletal: Negative.   Skin: Negative.   Neurological:  Positive for weakness.  All other systems reviewed and are negative.   Physical Exam Updated Vital Signs BP 125/78 (BP Location: Left Arm)   Pulse 90   Temp 98.5 F (36.9 C) (Oral)   Resp 18   Ht 5\' 4"  (1.626 m)   Wt 120.7 kg   SpO2 100%   BMI 45.68 kg/m  Physical Exam Vitals and nursing note reviewed.  Constitutional:      General: She is not in acute distress.    Appearance: She is well-developed. She is obese. She is not ill-appearing, toxic-appearing or diaphoretic.  HENT:     Head: Atraumatic.     Jaw: There is normal  jaw occlusion.     Mouth/Throat:     Mouth: Mucous membranes are dry.  Eyes:     Pupils: Pupils are equal, round, and reactive to light.  Cardiovascular:     Rate and Rhythm: Normal rate.     Pulses: Normal pulses.          Radial pulses are 2+ on the right side and 2+ on the left side.     Heart sounds: Normal heart sounds.  Pulmonary:     Effort: Pulmonary effort is normal. No respiratory distress.     Breath sounds: Normal breath sounds and air entry.     Comments: Clear bilaterally, speaks in full sentences without difficulty Abdominal:     General: Bowel sounds are normal. There is no distension.     Palpations: Abdomen is soft.     Tenderness: There is generalized abdominal tenderness and tenderness in the epigastric area. There is no right CVA tenderness, left CVA tenderness, guarding or rebound. Negative signs include Murphy's sign and McBurney's sign.     Comments: Gen tenderness worse to epigastric region. Neg Murphy sign  Musculoskeletal:        General: Normal range of motion.     Cervical back: Normal range of motion.     Comments: Full ROM, comparments soft  Skin:    General: Skin is warm and dry.     Capillary Refill: Capillary refill takes less than 2 seconds.  Neurological:     General: No focal deficit present.     Mental Status: She is alert.  Psychiatric:        Mood and Affect: Mood normal.    ED Results / Procedures / Treatments   Labs (all labs ordered are listed, but only abnormal results are displayed) Labs Reviewed  COMPREHENSIVE METABOLIC PANEL - Abnormal; Notable for the following components:      Result Value   Glucose, Bld 216 (*)    All other components within normal limits  CBC WITH DIFFERENTIAL/PLATELET - Abnormal; Notable for the following components:   RBC 5.58 (*)    MCV 74.9 (*)    MCH 24.4 (*)    All other components within normal limits  URINALYSIS, ROUTINE W REFLEX MICROSCOPIC - Abnormal; Notable for the following components:    Specific Gravity, Urine >1.030 (*)    Glucose, UA 250 (*)    Hgb urine dipstick SMALL (*)    Ketones, ur >80 (*)    Protein, ur >300 (*)    All other components within normal limits  URINALYSIS, MICROSCOPIC (REFLEX) - Abnormal; Notable for the following components:   Bacteria, UA RARE (*)    All other components within normal limits  CBG MONITORING, ED - Abnormal; Notable for the following components:   Glucose-Capillary 182 (*)    All  other components within normal limits  LIPASE, BLOOD  HCG, SERUM, QUALITATIVE    EKG None  Radiology CT ABDOMEN PELVIS W CONTRAST Result Date: 03/11/2023 CLINICAL DATA:  Acute nonlocalized abdominal pain. EXAM: CT ABDOMEN AND PELVIS WITH CONTRAST TECHNIQUE: Multidetector CT imaging of the abdomen and pelvis was performed using the standard protocol following bolus administration of intravenous contrast. RADIATION DOSE REDUCTION: This exam was performed according to the departmental dose-optimization program which includes automated exposure control, adjustment of the mA and/or kV according to patient size and/or use of iterative reconstruction technique. CONTRAST:  75mL OMNIPAQUE IOHEXOL 350 MG/ML SOLN COMPARISON:  None Available. FINDINGS: Lower chest: Lung bases are clear. Heart is enlarged. No pericardial or pleural effusion. Air in the esophagus can be seen with dysmotility. Hepatobiliary: Liver is decreased in attenuation diffusely. Small stones layer in the gallbladder. No biliary ductal dilatation. Pancreas: Negative. Spleen: Negative. Adrenals/Urinary Tract: Adrenal glands and kidneys are unremarkable. Ureters are decompressed. Bladder is low in volume. Stomach/Bowel: Stomach, small bowel, appendix and colon are unremarkable. Vascular/Lymphatic: Atherosclerotic calcification of the aorta. No pathologically enlarged lymph nodes. Reproductive: Intrauterine contraceptive device in place. 2.5 cm simple appearing cyst in the right adnexa. No follow-up imaging  recommended. Note: This recommendation does not apply to premenarchal patients and to those with increased risk (genetic, family history, elevated tumor markers or other high-risk factors) of ovarian cancer. Reference: JACR 2020 Feb; 17(2):248-254. Other: No free fluid.  Mesenteries and peritoneum are unremarkable. Musculoskeletal: Degenerative changes in the spine. IMPRESSION: 1. No findings to explain the patient's pain. 2. Hepatic steatosis. 3. Cholelithiasis. 4.  Aortic atherosclerosis (ICD10-I70.0). Electronically Signed   By: Leanna Battles M.D.   On: 03/11/2023 20:18    Procedures Procedures    Medications Ordered in ED Medications  HYDROcodone-acetaminophen (NORCO/VICODIN) 5-325 MG per tablet 1 tablet (1 tablet Oral Given 03/11/23 1850)  ondansetron (ZOFRAN) injection 4 mg (4 mg Intravenous Given 03/11/23 1947)  morphine (PF) 2 MG/ML injection 4 mg (4 mg Intravenous Given 03/11/23 1949)  sodium chloride 0.9 % bolus 1,000 mL (0 mLs Intravenous Stopped 03/11/23 2159)  iohexol (OMNIPAQUE) 350 MG/ML injection 75 mL (75 mLs Intravenous Contrast Given 03/11/23 2010)  ondansetron (ZOFRAN) injection 4 mg (4 mg Intravenous Given 03/11/23 2110)  HYDROmorphone (DILAUDID) injection 1 mg (1 mg Intravenous Given 03/11/23 2108)    ED Course/ Medical Decision Making/ A&P   53 year old here for evaluation nausea, vomiting and diarrhea.  Initially thought was due to accidentally taking 2 doses of Mounjaro on Wednesday.  Seen by urgent care had persistent emesis subsequently sent here.  Tested positive for influenza.  On arrival she is afebrile, nonseptic, not ill-appearing.  She is in generalized upper abdominal tenderness however negative Murphy sign..  She does appear clinically dehydrated.  Will plan on labs, imaging and reassess.  CT scan ordered from triage provider.  Labs and imaging personally viewed and interpreted:  CT abdomen pelvis without acute abnormality UA negative for infection does show glucose  urea, ketonuria, proteinuria CBC without leukocytosis Metabolic panel glucose 216, normal bicarb, anion gap 13 Pregnancy test negative Lipase 44  Will give IV fluids, pain management and reassess  Patient reassessed.  Tolerated p.o. challenge.  She initially have some mild nausea however does not want any additional antiemetics here.  We discussed management at home.  She states she already has Zofran.  Will DC home with Phenergan.  Encourage clear liquid diet, return for any worsening symptoms.  Patient is nontoxic, nonseptic appearing, in no  apparent distress.  Patient's pain and other symptoms adequately managed in emergency department.  Fluid bolus given.  Labs, imaging and vitals reviewed.  Patient does not meet the SIRS or Sepsis criteria.  On repeat exam patient does not have a surgical abdomin and there are no peritoneal signs.  No indication of appendicitis, bowel obstruction, bowel perforation, cholecystitis, diverticulitis, PID, intermittent/persistent torsion, TOA, AAA, dissection, unstable angina, pneumonia, pneumothorax, pneumomediastinum or ectopic pregnancy.  Patient discharged home with symptomatic treatment and given strict instructions for follow-up with their primary care physician.  I have also discussed reasons to return immediately to the ER.  Patient expresses understanding and agrees with plan.                                  Medical Decision Making Amount and/or Complexity of Data Reviewed External Data Reviewed: labs, radiology and notes. Labs: ordered. Decision-making details documented in ED Course. Radiology: ordered and independent interpretation performed. Decision-making details documented in ED Course.  Risk OTC drugs. Prescription drug management. Parenteral controlled substances. Decision regarding hospitalization. Diagnosis or treatment significantly limited by social determinants of health.           Final Clinical Impression(s) / ED  Diagnoses Final diagnoses:  Influenza  Nausea vomiting and diarrhea  Epigastric pain    Rx / DC Orders ED Discharge Orders          Ordered    promethazine (PHENERGAN) 25 MG tablet  Every 6 hours PRN        03/11/23 2232              Quest Tavenner A, PA-C 03/11/23 2233    Kommor, Wyn Forster, MD 03/12/23 [phone]

## 2023-03-13 ENCOUNTER — Other Ambulatory Visit: Payer: Self-pay

## 2023-03-13 ENCOUNTER — Emergency Department (HOSPITAL_COMMUNITY)
Admission: EM | Admit: 2023-03-13 | Discharge: 2023-03-13 | Disposition: A | Discharge status: Home / Self Care | Payer: No Typology Code available for payment source | Attending: Emergency Medicine | Admitting: Emergency Medicine

## 2023-03-13 DIAGNOSIS — E119 Type 2 diabetes mellitus without complications: Secondary | ICD-10-CM | POA: Diagnosis not present

## 2023-03-13 DIAGNOSIS — R9431 Abnormal electrocardiogram [ECG] [EKG]: Secondary | ICD-10-CM | POA: Insufficient documentation

## 2023-03-13 DIAGNOSIS — Z7984 Long term (current) use of oral hypoglycemic drugs: Secondary | ICD-10-CM | POA: Insufficient documentation

## 2023-03-13 DIAGNOSIS — I10 Essential (primary) hypertension: Secondary | ICD-10-CM | POA: Diagnosis not present

## 2023-03-13 DIAGNOSIS — K3184 Gastroparesis: Secondary | ICD-10-CM | POA: Insufficient documentation

## 2023-03-13 DIAGNOSIS — R1084 Generalized abdominal pain: Secondary | ICD-10-CM | POA: Diagnosis present

## 2023-03-13 DIAGNOSIS — E876 Hypokalemia: Secondary | ICD-10-CM | POA: Insufficient documentation

## 2023-03-13 DIAGNOSIS — Z79899 Other long term (current) drug therapy: Secondary | ICD-10-CM | POA: Diagnosis not present

## 2023-03-13 LAB — CBC WITH DIFFERENTIAL/PLATELET
Abs Immature Granulocytes: 0.05 10*3/uL (ref 0.00–0.07)
Basophils Absolute: 0 10*3/uL (ref 0.0–0.1)
Basophils Relative: 0 %
Eosinophils Absolute: 0.1 10*3/uL (ref 0.0–0.5)
Eosinophils Relative: 1 %
HCT: 42.4 % (ref 36.0–46.0)
Hemoglobin: 13.5 g/dL (ref 12.0–15.0)
Immature Granulocytes: 1 %
Lymphocytes Relative: 28 %
Lymphs Abs: 2.8 10*3/uL (ref 0.7–4.0)
MCH: 24.2 pg — ABNORMAL LOW (ref 26.0–34.0)
MCHC: 31.8 g/dL (ref 30.0–36.0)
MCV: 75.8 fL — ABNORMAL LOW (ref 80.0–100.0)
Monocytes Absolute: 0.6 10*3/uL (ref 0.1–1.0)
Monocytes Relative: 6 %
Neutro Abs: 6.4 10*3/uL (ref 1.7–7.7)
Neutrophils Relative %: 64 %
Platelets: 355 10*3/uL (ref 150–400)
RBC: 5.59 MIL/uL — ABNORMAL HIGH (ref 3.87–5.11)
RDW: 14.3 % (ref 11.5–15.5)
WBC: 10 10*3/uL (ref 4.0–10.5)
nRBC: 0 % (ref 0.0–0.2)

## 2023-03-13 LAB — COMPREHENSIVE METABOLIC PANEL
ALT: 35 U/L (ref 0–44)
AST: 36 U/L (ref 15–41)
Albumin: 3.6 g/dL (ref 3.5–5.0)
Alkaline Phosphatase: 71 U/L (ref 38–126)
Anion gap: 14 (ref 5–15)
BUN: 14 mg/dL (ref 6–20)
CO2: 22 mmol/L (ref 22–32)
Calcium: 8.4 mg/dL — ABNORMAL LOW (ref 8.9–10.3)
Chloride: 101 mmol/L (ref 98–111)
Creatinine, Ser: 0.91 mg/dL (ref 0.44–1.00)
GFR, Estimated: >60 mL/min (ref >60)
Glucose, Bld: 213 mg/dL — ABNORMAL HIGH (ref 70–99)
Potassium: 3.1 mmol/L — ABNORMAL LOW (ref 3.5–5.1)
Sodium: 137 mmol/L (ref 135–145)
Total Bilirubin: 0.5 mg/dL (ref 0.0–1.2)
Total Protein: 7 g/dL (ref 6.5–8.1)

## 2023-03-13 LAB — LIPASE, BLOOD: Lipase: 55 U/L — ABNORMAL HIGH (ref 11–51)

## 2023-03-13 MED ORDER — ALUM & MAG HYDROXIDE-SIMETH 200-200-20 MG/5ML PO SUSP
30.0000 mL | Freq: Once | ORAL | Status: AC
  Administered 2023-03-13: 30 mL via ORAL
  Filled 2023-03-13: qty 30

## 2023-03-13 MED ORDER — PANTOPRAZOLE SODIUM 40 MG IV SOLR
40.0000 mg | Freq: Once | INTRAVENOUS | Status: AC
  Administered 2023-03-13: 40 mg via INTRAVENOUS
  Filled 2023-03-13: qty 10

## 2023-03-13 MED ORDER — TRIMETHOBENZAMIDE HCL 300 MG PO CAPS: 300.0000 mg | ORAL_CAPSULE | Freq: Three times a day (TID) | ORAL | 0 refills | Status: AC

## 2023-03-13 MED ORDER — MORPHINE SULFATE (PF) 4 MG/ML IV SOLN
4.0000 mg | Freq: Once | INTRAVENOUS | Status: AC
  Administered 2023-03-13: 4 mg via INTRAVENOUS
  Filled 2023-03-13: qty 1

## 2023-03-13 MED ORDER — POTASSIUM CHLORIDE CRYS ER 20 MEQ PO TBCR: 20.0000 meq | EXTENDED_RELEASE_TABLET | Freq: Two times a day (BID) | ORAL | 0 refills | Status: DC

## 2023-03-13 MED ORDER — LORAZEPAM 2 MG/ML IJ SOLN
1.0000 mg | Freq: Once | INTRAMUSCULAR | Status: AC
  Administered 2023-03-13: 1 mg via INTRAVENOUS
  Filled 2023-03-13: qty 1

## 2023-03-13 MED ORDER — ONDANSETRON 4 MG PO TBDP
4.0000 mg | ORAL_TABLET | Freq: Once | ORAL | Status: AC
Start: 2023-03-13 — End: 2023-03-13
  Administered 2023-03-13: 4 mg via ORAL
  Filled 2023-03-13: qty 1

## 2023-03-13 MED ORDER — SODIUM CHLORIDE 0.9 % IV BOLUS
1000.0000 mL | Freq: Once | INTRAVENOUS | Status: AC
  Administered 2023-03-13: 1000 mL via INTRAVENOUS

## 2023-03-13 MED ORDER — METOCLOPRAMIDE HCL 5 MG/ML IJ SOLN
10.0000 mg | Freq: Once | INTRAMUSCULAR | Status: AC
  Administered 2023-03-13: 10 mg via INTRAVENOUS
  Filled 2023-03-13: qty 2

## 2023-03-13 MED ORDER — LIDOCAINE VISCOUS HCL 2 % MT SOLN
15.0000 mL | Freq: Once | OROMUCOSAL | Status: AC
  Administered 2023-03-13: 15 mL via OROMUCOSAL
  Filled 2023-03-13: qty 15

## 2023-03-13 NOTE — ED Triage Notes (Signed)
Pt. Stated, I started having N/V and stomach pain since Saturday. I was here on Saturday for the same. I was a lot better yesterday and it came back.

## 2023-03-13 NOTE — Discharge Instructions (Signed)
Please follow-up with your primary care provider regards to recent symptoms and ER visit.  Today you are seen with reassuring labs however with your history I do believe you are having gastroparesis from the [facility] along with the flu.  Please take Tylenol every 6 hours as needed for pain.  I have changed your antiemetics a please stop taking the Phenergan and use the Tigan I have prescribed for you.  I have also given you few days with the potassium pills as well as your potassium is slightly low here.  If symptoms change or worsen please return to the ER.

## 2023-03-13 NOTE — ED Notes (Signed)
Patient states she is still not able to urinate.

## 2023-03-13 NOTE — ED Provider Notes (Signed)
[place] EMERGENCY DEPARTMENT AT [facility] Provider Note   CSN: [id number] Arrival date & time: 03/13/23  7829     History  Chief Complaint  Patient presents with   Abdominal Pain   Emesis   Nausea    [name] is a 53 y.o. female history of hypertension, diabetes, hyperlipidemia currently on Mounjaro presented with generalized abdominal pain that began last night.  Patient was recently seen in the ED 2 days ago and states this is the same pain.  Patient notes that last week she started [facility] after being switched from Ozempic and states that she double dosed the [facility] and thinks that is what may have caused her pain.  Patient also tested positive for the flu as well at this time.  Patient states that she has had some emesis last night and states that yesterday she had chicken broth along with water and was feeling well until dinnertime when she had spaghetti and then began having symptoms again.  Patient said the Phenergan was working initially however did not help after she had the spaghetti.  Patient denies any new features of her abdominal pain from 2 days ago.  Home Medications Prior to Admission medications   Medication Sig Start Date End Date Taking? Authorizing Provider  atorvastatin (LIPITOR) 40 MG tablet Take 1 tablet (40 mg total) by mouth daily. For cholesterol 03/01/23  Yes Stacks, Broadus John, MD  cholecalciferol (VITAMIN D3) 25 MCG (1000 UNIT) tablet Take 2,000 Units by mouth daily.   Yes [provider]  dicyclomine (BENTYL) 10 MG capsule Take 10 mg by mouth in the morning, at noon, and at bedtime. 03/11/23  Yes [provider]  hydrochlorothiazide (HYDRODIURIL) 25 MG tablet Take 0.5-1 tablets (12.5-25 mg total) by mouth daily as needed (swelling). Patient taking differently: Take 25 mg by mouth daily as needed (swelling). 08/15/22  Yes Mechele Claude, MD  levonorgestrel (MIRENA) 20 MCG/24HR IUD Mirena 20 mcg/24 hours (5 yrs) 52 mg  intrauterine device  Take 1 device by intrauterine route.   Yes [provider]  lisinopril (ZESTRIL) 30 MG tablet Take 1 tablet (30 mg total) by mouth daily. 03/01/23  Yes Mechele Claude, MD  metFORMIN (GLUCOPHAGE-XR) 500 MG 24 hr tablet Take 2 tablets (1,000 mg total) by mouth daily with breakfast. 03/01/23  Yes Mechele Claude, MD  ondansetron (ZOFRAN-ODT) 4 MG disintegrating tablet Take 4 mg by mouth every 8 (eight) hours as needed for nausea or vomiting. 03/11/23  Yes [provider]  potassium chloride SA (KLOR-CON M) 20 MEQ tablet Take 1 tablet (20 mEq total) by mouth 2 (two) times daily for 3 days. 03/13/23 03/16/23 Yes Mercadez Heitman, Beverly Gust, PA-C  tirzepatide [facility]) 10 MG/0.5ML Pen Inject 10 mg into the skin once a week. 03/01/23  Yes Mechele Claude, MD  trimethobenzamide (TIGAN) 300 MG capsule Take 1 capsule (300 mg total) by mouth 3 (three) times daily for 5 days. 03/13/23 03/18/23 Yes Jhamal Plucinski, Beverly Gust, PA-C  Blood Glucose Monitoring Suppl (ONETOUCH VERIO REFLECT) w/Device KIT CHECK BLOOD SUGAR 3 TIMES  DAILY AS DIRECTED 10/13/21   Mechele Claude, MD  glucose blood (ONETOUCH VERIO) test strip TEST BLOOD SUGAR 3 TIMES  DAILY Dx E11.9 07/26/21   Mechele Claude, MD  Lancets (ONETOUCH DELICA PLUS LANCET33G) MISC TEST BLOOD SUGAR 3 TIMES  DAILY Dx E11.9 07/26/21   Mechele Claude, MD  Semaglutide, 2 MG/DOSE, (OZEMPIC, 2 MG/DOSE,) 8 MG/3ML SOPN Inject 2 mg into the skin once a week. Patient not  taking: Reported on 03/13/2023 03/01/23   Mechele Claude, MD      Allergies    Oxycodone-acetaminophen and Percocet [oxycodone-acetaminophen]    Review of Systems   Review of Systems  Gastrointestinal:  Positive for abdominal pain and vomiting.    Physical Exam Updated Vital Signs BP 121/73   Pulse 80   Temp 98.1 F (36.7 C)   Resp 15   SpO2 100%  Physical Exam Vitals reviewed.  Constitutional:      General: She is not in acute distress.    Comments: Appears uncomfortable  HENT:     Head:  Normocephalic and atraumatic.  Eyes:     Extraocular Movements: Extraocular movements intact.     Conjunctiva/sclera: Conjunctivae normal.     Pupils: Pupils are equal, round, and reactive to light.  Cardiovascular:     Rate and Rhythm: Normal rate and regular rhythm.     Pulses: Normal pulses.     Heart sounds: Normal heart sounds.     Comments: 2+ bilateral radial/dorsalis pedis pulses with regular rate Pulmonary:     Effort: Pulmonary effort is normal. No respiratory distress.     Breath sounds: Normal breath sounds.  Abdominal:     Palpations: Abdomen is soft.     Tenderness: There is abdominal tenderness. There is no guarding or rebound. Negative signs include Murphy's sign, Rovsing's sign, McBurney's sign and psoas sign.  Musculoskeletal:        General: Normal range of motion.     Cervical back: Normal range of motion and neck supple.     Comments: 5 out of 5 bilateral grip/leg extension strength  Skin:    General: Skin is warm and dry.     Capillary Refill: Capillary refill takes less than 2 seconds.  Neurological:     General: No focal deficit present.     Mental Status: She is alert and oriented to person, place, and time.     Comments: Sensation intact in all 4 limbs  Psychiatric:        Mood and Affect: Mood normal.     ED Results / Procedures / Treatments   Labs (all labs ordered are listed, but only abnormal results are displayed) Labs Reviewed  CBC WITH DIFFERENTIAL/PLATELET - Abnormal; Notable for the following components:      Result Value   RBC 5.59 (*)    MCV 75.8 (*)    MCH 24.2 (*)    All other components within normal limits  COMPREHENSIVE METABOLIC PANEL - Abnormal; Notable for the following components:   Potassium 3.1 (*)    Glucose, Bld 213 (*)    Calcium 8.4 (*)    All other components within normal limits  LIPASE, BLOOD - Abnormal; Notable for the following components:   Lipase 55 (*)    All other components within normal limits   URINALYSIS, ROUTINE W REFLEX MICROSCOPIC    EKG EKG Interpretation Date/Time:  Monday March 13 2023 08:03:30 EST Ventricular Rate:  72 PR Interval:  119 QRS Duration:  106 QT Interval:  453 QTC Calculation: 496 R Axis:   35  Text Interpretation: Sinus rhythm Borderline short PR interval Borderline prolonged QT interval Baseline wander Confirmed by Gerhard Munch [phone]) on 03/13/2023 8:06:21 AM  Radiology CT ABDOMEN PELVIS W CONTRAST Result Date: 03/11/2023 CLINICAL DATA:  Acute nonlocalized abdominal pain. EXAM: CT ABDOMEN AND PELVIS WITH CONTRAST TECHNIQUE: Multidetector CT imaging of the abdomen and pelvis was performed using the standard protocol following bolus administration  of intravenous contrast. RADIATION DOSE REDUCTION: This exam was performed according to the departmental dose-optimization program which includes automated exposure control, adjustment of the mA and/or kV according to patient size and/or use of iterative reconstruction technique. CONTRAST:  75mL OMNIPAQUE IOHEXOL 350 MG/ML SOLN COMPARISON:  None Available. FINDINGS: Lower chest: Lung bases are clear. Heart is enlarged. No pericardial or pleural effusion. Air in the esophagus can be seen with dysmotility. Hepatobiliary: Liver is decreased in attenuation diffusely. Small stones layer in the gallbladder. No biliary ductal dilatation. Pancreas: Negative. Spleen: Negative. Adrenals/Urinary Tract: Adrenal glands and kidneys are unremarkable. Ureters are decompressed. Bladder is low in volume. Stomach/Bowel: Stomach, small bowel, appendix and colon are unremarkable. Vascular/Lymphatic: Atherosclerotic calcification of the aorta. No pathologically enlarged lymph nodes. Reproductive: Intrauterine contraceptive device in place. 2.5 cm simple appearing cyst in the right adnexa. No follow-up imaging recommended. Note: This recommendation does not apply to premenarchal patients and to those with increased risk (genetic, family  history, elevated tumor markers or other high-risk factors) of ovarian cancer. Reference: JACR 2020 Feb; 17(2):248-254. Other: No free fluid.  Mesenteries and peritoneum are unremarkable. Musculoskeletal: Degenerative changes in the spine. IMPRESSION: 1. No findings to explain the patient's pain. 2. Hepatic steatosis. 3. Cholelithiasis. 4.  Aortic atherosclerosis (ICD10-I70.0). Electronically Signed   By: Leanna Battles M.D.   On: 03/11/2023 20:18    Procedures Procedures    Medications Ordered in ED Medications  ondansetron (ZOFRAN-ODT) disintegrating tablet 4 mg (4 mg Oral Given 03/13/23 0716)  sodium chloride 0.9 % bolus 1,000 mL (0 mLs Intravenous Stopped 03/13/23 0915)  metoCLOPramide (REGLAN) injection 10 mg (10 mg Intravenous Given 03/13/23 0750)  morphine (PF) 4 MG/ML injection 4 mg (4 mg Intravenous Given 03/13/23 0747)  pantoprazole (PROTONIX) injection 40 mg (40 mg Intravenous Given 03/13/23 0753)  LORazepam (ATIVAN) injection 1 mg (1 mg Intravenous Given 03/13/23 0836)  alum & mag hydroxide-simeth (MAALOX/MYLANTA) 200-200-20 MG/5ML suspension 30 mL (30 mLs Oral Given 03/13/23 0955)  lidocaine (XYLOCAINE) 2 % viscous mouth solution 15 mL (15 mLs Mouth/Throat Given 03/13/23 0630)    ED Course/ Medical Decision Making/ A&P                                 Medical Decision Making Amount and/or Complexity of Data Reviewed Labs: ordered.  Risk Prescription drug management.   [name] 53 y.o. presented today for abdominal pain.  Working DDx that I considered at this time includes, but not limited to, gastroparesis, gastroenteritis, colitis, small bowel obstruction, appendicitis, cholecystitis, hepatobiliary pathology, gastritis, PUD, ACS, aortic dissection, diverticulosis/diverticulitis, pancreatitis, nephrolithiasis, medication induced, AAA, UTI, pyelonephritis, ruptured ectopic pregnancy, PID, ovarian torsion.  R/o DDx: gastroenteritis, colitis, small bowel obstruction, appendicitis,  cholecystitis, hepatobiliary pathology, gastritis, PUD, ACS, aortic dissection, diverticulosis/diverticulitis, pancreatitis, nephrolithiasis, medication induced, AAA, UTI, pyelonephritis, ruptured ectopic pregnancy, PID, ovarian torsio: These are considered less likely due to history of present illness, physical exam, labs/imaging findings.  Review of prior external notes: 03/11/2023 ED provider  Unique Tests and My Independent Interpretation:  CBC with differential: Unremarkable CMP: Mild hypokalemia 3.1 Lipase: Minimally elevated 55 EKG: Sinus 72 bpm, prolonged QT most likely secondary to antiemetics, no ST elevations or depressions noted  Social Determinants of Health: none  Discussion with Independent Historian:  Son  Discussion of Management of Tests: None  Risk: Medium: prescription drug management  Risk Stratification Score: None  Plan: On exam patient was in no acute  distress with stable vitals however did appear uncomfortable on exam.  Patient did have generalized tenderness without peritoneal signs.  Patient is recent seen 2 days ago and had advanced imaging along with large workup that was ultimately reassuring and she was sent home.  Do suspect patient is having gastroparesis secondary to taking too much Mounjaro the other day along with testing positive for the flu upon chart review of outside records.  Will give patient Reglan here to help with her nausea and pain along with some pain meds give fluids and recheck labs but do feel we can get her symptoms under control and p.o. challenge or patient will be safe to go home on Reglan follow-up with her primary care provider.  Do not feel we need to repeat advanced imaging as patient's history is consistent with gastroparesis and that she is still passing gas.  Patient labs are reassuring however potassium is minimally low most likely from her emesis and so we will replenish this.  Patient states she still has ongoing abdominal pain  but appears clinically well and has stable vitals with reassuring labs.  Patient did not have any abdominal tenderness or peritoneal signs that would necessitate repeat advanced imaging at this time and states that she is passing gas and having bowel moods that issue.  Do feel patient is having gastroparesis from the spaghetti last night but given the ongoing pain we will give Ativan here.  When I went to go reevaluate the patient patient did not had any episodes of emesis here and so we will give GI cocktail.  Patient was able to tolerate the GI cocktail and so has thus passed p.o. challenge.  Patient not had any episodes of emesis here and after shared decision making patient was in agreement with outpatient therapy.  I do feel patient is having gastroparesis most likely from the double dose of Mounjaro along with the flu worsening her symptoms.  Patient will be given a few days worth of potassium pills as well.  Patient did have slightly prolonged QT here most likely from her antiemetics and so we will give Tigan as this will not prolong her QT and encouraged her to remain hydrated and eat food as tolerated and follow-up with her primary care provider.  Patient was given return precautions. Patient stable for discharge at this time.  Patient verbalized understanding of plan.  This chart was dictated using voice recognition software.  Despite best efforts to proofread,  errors can occur which can change the documentation meaning.         Final Clinical Impression(s) / ED Diagnoses Final diagnoses:  Gastroparesis  Hypokalemia  Prolonged Q-T interval on ECG    Rx / DC Orders ED Discharge Orders          Ordered    trimethobenzamide (TIGAN) 300 MG capsule  3 times daily        03/13/23 1044    potassium chloride SA (KLOR-CON M) 20 MEQ tablet  2 times daily        03/13/23 1044              Remi Deter 03/13/23 1046    Gerhard Munch, MD 03/13/23 1525

## 2023-03-14 ENCOUNTER — Ambulatory Visit: Payer: Self-pay | Admitting: Family Medicine

## 2023-03-14 ENCOUNTER — Encounter (HOSPITAL_BASED_OUTPATIENT_CLINIC_OR_DEPARTMENT_OTHER): Payer: Self-pay

## 2023-03-14 DIAGNOSIS — Z794 Long term (current) use of insulin: Secondary | ICD-10-CM | POA: Insufficient documentation

## 2023-03-14 DIAGNOSIS — Z7984 Long term (current) use of oral hypoglycemic drugs: Secondary | ICD-10-CM | POA: Diagnosis not present

## 2023-03-14 DIAGNOSIS — R112 Nausea with vomiting, unspecified: Secondary | ICD-10-CM | POA: Diagnosis present

## 2023-03-14 DIAGNOSIS — R1013 Epigastric pain: Secondary | ICD-10-CM | POA: Diagnosis not present

## 2023-03-14 DIAGNOSIS — J101 Influenza due to other identified influenza virus with other respiratory manifestations: Secondary | ICD-10-CM | POA: Diagnosis not present

## 2023-03-14 LAB — URINALYSIS, ROUTINE W REFLEX MICROSCOPIC
Bacteria, UA: NONE SEEN
Bilirubin Urine: NEGATIVE
Glucose, UA: NEGATIVE mg/dL
Ketones, ur: >80 mg/dL — AB
Leukocytes,Ua: NEGATIVE
Nitrite: NEGATIVE
Protein, ur: >300 mg/dL — AB
Specific Gravity, Urine: 1.033 — ABNORMAL HIGH (ref 1.005–1.030)
pH: 7 (ref 5.0–8.0)

## 2023-03-14 LAB — COMPREHENSIVE METABOLIC PANEL
ALT: 26 U/L (ref 0–44)
AST: 23 U/L (ref 15–41)
Albumin: 4.4 g/dL (ref 3.5–5.0)
Alkaline Phosphatase: 71 U/L (ref 38–126)
Anion gap: 14 (ref 5–15)
BUN: 15 mg/dL (ref 6–20)
CO2: 26 mmol/L (ref 22–32)
Calcium: 8.8 mg/dL — ABNORMAL LOW (ref 8.9–10.3)
Chloride: 95 mmol/L — ABNORMAL LOW (ref 98–111)
Creatinine, Ser: 0.75 mg/dL (ref 0.44–1.00)
GFR, Estimated: >60 mL/min (ref >60)
Glucose, Bld: 145 mg/dL — ABNORMAL HIGH (ref 70–99)
Potassium: 2.8 mmol/L — ABNORMAL LOW (ref 3.5–5.1)
Sodium: 135 mmol/L (ref 135–145)
Total Bilirubin: 0.8 mg/dL (ref 0.0–1.2)
Total Protein: 7.9 g/dL (ref 6.5–8.1)

## 2023-03-14 LAB — LIPASE, BLOOD: Lipase: 41 U/L (ref 11–51)

## 2023-03-14 LAB — CBC
HCT: 41.7 % (ref 36.0–46.0)
Hemoglobin: 13.9 g/dL (ref 12.0–15.0)
MCH: 24.5 pg — ABNORMAL LOW (ref 26.0–34.0)
MCHC: 33.3 g/dL (ref 30.0–36.0)
MCV: 73.4 fL — ABNORMAL LOW (ref 80.0–100.0)
Platelets: 339 10*3/uL (ref 150–400)
RBC: 5.68 MIL/uL — ABNORMAL HIGH (ref 3.87–5.11)
RDW: 13.4 % (ref 11.5–15.5)
WBC: 9.9 10*3/uL (ref 4.0–10.5)
nRBC: 0 % (ref 0.0–0.2)

## 2023-03-14 LAB — PREGNANCY, URINE: Preg Test, Ur: NEGATIVE

## 2023-03-14 NOTE — Telephone Encounter (Signed)
Pt informed of recommendations. LS

## 2023-03-14 NOTE — ED Triage Notes (Signed)
Pt states that she has been having n/v/d for the past week, diagnosed with flu on Sat, seen at ED and UC for the same. Recently had medication changes of GLP1 meds.

## 2023-03-14 NOTE — Telephone Encounter (Signed)
 Chief Complaint: abdominal pain Symptoms: pain, vomiting, nausea Frequency: since 03/11/23, resolved and returned yesterday- constant at this time. Pertinent Negatives: Patient denies diarrhea, chest pain Disposition: [x] ED /[] Urgent Care (no appt availability in office) / [] Appointment(In office/virtual)/ []  [facility]/ [] Home Care/ [x] Refused Recommended Disposition /[] [facility]/ []  Follow-up with PCP Additional Notes: Patient son, Ila on [place], called with patient present stating patient took a double dose of her monjuaro on 03/07/23 and began having abdominal pain 03/11/23. He states patient has been seen in ED twice, most recent was yesterday. States she has decreased oral intake and is still vomiting every time she drinks. Was prescribed medication in ED but has not been able to obtain yet. Per protocol, patient to be evaluated in ED now. Patient declines dispo, requesting call back from PCP. Care advice reviewed, alerting PCP for review and follow up. Contacted [facility], no answer.   Copied from CRM [id number]. Topic: Clinical - Red Word Triage >> Mar 14, 2023 11:43 AM Elle L wrote: Red Word that prompted transfer to Nurse Triage: The patient's son states that the patient double dosed on her Mounjaro  on accident and then developed the flu. She has severe abnominal pain and is throwing up everything that she takes. She has been to the Emergency Room twice. However, she is not getting better. Reason for Disposition  [1] SEVERE pain (e.g., excruciating) AND [2] present > 1 hour  Answer Assessment - Initial Assessment Questions 1. LOCATION: Where does it hurt?      Upper abdomen 2. RADIATION: Does the pain shoot anywhere else? (e.g., chest, back)     Denies radiation- feels like contractions 3. ONSET: When did the pain begin? (e.g., minutes, hours or days ago)      Returned yesterday, has been intermittent since 4. SUDDEN: Gradual or sudden onset?     Sudden 5.  PATTERN Does the pain come and go, or is it constant?    - If it comes and goes: How long does it last? Do you have pain now?     (Note: Comes and goes means the pain is intermittent. It goes away completely between bouts.)    - If constant: Is it getting better, staying the same, or getting worse?      (Note: Constant means the pain never goes away completely; most serious pain is constant and gets worse.)      03/11/23- Constant since yesterday. 6. SEVERITY: How bad is the pain?  (e.g., Scale 1-10; mild, moderate, or severe)    - MILD (1-3): Doesn't interfere with normal activities, abdomen soft and not tender to touch..     - MODERATE (4-7): Interferes with normal activities or awakens from sleep, abdomen tender to touch.     - SEVERE (8-10): Excruciating pain, doubled over, unable to do any normal activities.       10/10 7. RECURRENT SYMPTOM: Have you ever had this type of stomach pain before? If Yes, ask: When was the last time? and What happened that time?      Denies, patient reports she took a double dose of monjauro accidentally 03/07/23 prior to symptoms starting.   8. AGGRAVATING FACTORS: Does anything seem to cause this pain? (e.g., foods, stress, alcohol)     Nothing making it worse, states she is unable to drink much. 9. CARDIAC SYMPTOMS: Do you have any of the following symptoms: chest pain, difficulty breathing, sweating, nausea?     nausea 10. OTHER SYMPTOMS: Do you have  any other symptoms? (e.g., back pain, diarrhea, fever, urination pain, vomiting)       Vomiting and nausea,  Protocols used: Abdominal Pain - Upper-A-AH

## 2023-03-15 ENCOUNTER — Other Ambulatory Visit: Payer: Self-pay

## 2023-03-15 ENCOUNTER — Emergency Department (HOSPITAL_BASED_OUTPATIENT_CLINIC_OR_DEPARTMENT_OTHER)
Admission: EM | Admit: 2023-03-15 | Discharge: 2023-03-15 | Disposition: A | Discharge status: Home / Self Care | Payer: No Typology Code available for payment source | Attending: Emergency Medicine | Admitting: Emergency Medicine

## 2023-03-15 DIAGNOSIS — J101 Influenza due to other identified influenza virus with other respiratory manifestations: Secondary | ICD-10-CM

## 2023-03-15 DIAGNOSIS — R112 Nausea with vomiting, unspecified: Secondary | ICD-10-CM

## 2023-03-15 LAB — MAGNESIUM: Magnesium: 2 mg/dL (ref 1.7–2.4)

## 2023-03-15 MED ORDER — DROPERIDOL 2.5 MG/ML IJ SOLN
1.2500 mg | Freq: Once | INTRAMUSCULAR | Status: AC
  Administered 2023-03-15: 1.25 mg via INTRAVENOUS
  Filled 2023-03-15: qty 2

## 2023-03-15 MED ORDER — PROMETHAZINE HCL 25 MG RE SUPP: 25.0000 mg | Freq: Four times a day (QID) | RECTAL | 0 refills | Status: DC | PRN

## 2023-03-15 MED ORDER — POTASSIUM CHLORIDE 20 MEQ PO PACK: 40.0000 meq | PACK | Freq: Every day | ORAL | Status: DC

## 2023-03-15 MED ORDER — MORPHINE SULFATE (PF) 4 MG/ML IV SOLN
4.0000 mg | Freq: Once | INTRAVENOUS | Status: AC
  Administered 2023-03-15: 4 mg via INTRAVENOUS
  Filled 2023-03-15 (×2): qty 1

## 2023-03-15 MED ORDER — PROMETHAZINE HCL 25 MG RE SUPP
25.0000 mg | Freq: Four times a day (QID) | RECTAL | 0 refills | Status: DC | PRN
Start: 2023-03-15 — End: 2023-04-04

## 2023-03-15 MED ORDER — POTASSIUM CHLORIDE 10 MEQ/100ML IV SOLN
10.0000 meq | Freq: Once | INTRAVENOUS | Status: AC
Start: 2023-03-15 — End: 2023-03-15
  Administered 2023-03-15: 10 meq via INTRAVENOUS
  Filled 2023-03-15: qty 100

## 2023-03-15 MED ORDER — DIPHENHYDRAMINE HCL 50 MG/ML IJ SOLN
25.0000 mg | Freq: Once | INTRAMUSCULAR | Status: AC
  Administered 2023-03-15: 25 mg via INTRAVENOUS
  Filled 2023-03-15: qty 1

## 2023-03-15 NOTE — ED Provider Notes (Signed)
 [place] EMERGENCY DEPARTMENT AT [facility] Provider Note   CSN: [id number] Arrival date & time: 03/14/23  1843     History  Chief Complaint  Patient presents with   Emesis    [name] is a 53 y.o. female.  53 yo F with a chief complaints of nausea and vomiting.  She has been having trouble keeping anything down for the past 3 to 4 days.  This is actually her third ED visit for the same.  She has Zofran  at home but has tried it but without improvement.  Has epigastric discomfort with this.  Denies diarrhea.  She did test positive for the flu at the onset of this illness.  She also recently changed her injectable diabetes medicine.   Emesis      Home Medications Prior to Admission medications   Medication Sig Start Date End Date Taking? Authorizing Provider  atorvastatin  (LIPITOR) 40 MG tablet Take 1 tablet (40 mg total) by mouth daily. For cholesterol 03/01/23   Zollie Lowers, MD  Blood Glucose Monitoring Suppl (ONETOUCH VERIO REFLECT) w/Device KIT CHECK BLOOD SUGAR 3 TIMES  DAILY AS DIRECTED 10/13/21   Zollie Lowers, MD  cholecalciferol (VITAMIN D3) 25 MCG (1000 UNIT) tablet Take 2,000 Units by mouth daily.    [provider]  dicyclomine (BENTYL) 10 MG capsule Take 10 mg by mouth in the morning, at noon, and at bedtime. 03/11/23   [provider]  glucose blood (ONETOUCH VERIO) test strip TEST BLOOD SUGAR 3 TIMES  DAILY Dx E11.9 07/26/21   Zollie Lowers, MD  hydrochlorothiazide  (HYDRODIURIL ) 25 MG tablet Take 0.5-1 tablets (12.5-25 mg total) by mouth daily as needed (swelling). Patient taking differently: Take 25 mg by mouth daily as needed (swelling). 08/15/22   Zollie Lowers, MD  Lancets [facility] DELICA PLUS VARA) MISC TEST BLOOD SUGAR 3 TIMES  DAILY Dx E11.9 07/26/21   Zollie Lowers, MD  levonorgestrel (MIRENA) 20 MCG/24HR IUD Mirena 20 mcg/24 hours (5 yrs) 52 mg intrauterine device  Take 1 device by intrauterine route.    [provider]  lisinopril  (ZESTRIL ) 30 MG tablet Take 1 tablet (30 mg total) by mouth daily. 03/01/23   Zollie Lowers, MD  metFORMIN  (GLUCOPHAGE -XR) 500 MG 24 hr tablet Take 2 tablets (1,000 mg total) by mouth daily with breakfast. 03/01/23   Zollie Lowers, MD  ondansetron  (ZOFRAN -ODT) 4 MG disintegrating tablet Take 4 mg by mouth every 8 (eight) hours as needed for nausea or vomiting. 03/11/23   [provider]  potassium chloride  SA (KLOR-CON  M) 20 MEQ tablet Take 1 tablet (20 mEq total) by mouth 2 (two) times daily for 3 days. 03/13/23 03/16/23  Victor Lynwood DASEN, PA-C  promethazine  (PHENERGAN ) 25 MG suppository Place 1 suppository (25 mg total) rectally every 6 (six) hours as needed for nausea or vomiting. 03/15/23   Emil Share, DO  Semaglutide , 2 MG/DOSE, (OZEMPIC , 2 MG/DOSE,) 8 MG/3ML SOPN Inject 2 mg into the skin once a week. Patient not taking: Reported on 03/13/2023 03/01/23   Zollie Lowers, MD  tirzepatide  (MOUNJARO ) 10 MG/0.5ML Pen Inject 10 mg into the skin once a week. 03/01/23   Zollie Lowers, MD  trimethobenzamide  (TIGAN ) 300 MG capsule Take 1 capsule (300 mg total) by mouth 3 (three) times daily for 5 days. 03/13/23 03/18/23  Victor Lynwood DASEN, PA-C      Allergies    Oxycodone-acetaminophen  and Percocet [oxycodone-acetaminophen ]    Review of Systems   Review of Systems  Gastrointestinal:  Positive for vomiting.    Physical Exam Updated Vital Signs BP (!) 153/84   Pulse 87   Temp 98 F (36.7 C) (Oral)   Resp 14   SpO2 97%  Physical Exam Vitals and nursing note reviewed.  Constitutional:      General: She is not in acute distress.    Appearance: She is well-developed. She is not diaphoretic.  HENT:     Head: Normocephalic and atraumatic.  Eyes:     Pupils: Pupils are equal, round, and reactive to light.  Cardiovascular:     Rate and Rhythm: Normal rate and regular rhythm.     Heart sounds: No murmur heard.    No friction rub. No gallop.  Pulmonary:     Effort:  Pulmonary effort is normal.     Breath sounds: No wheezing or rales.  Abdominal:     General: There is no distension.     Palpations: Abdomen is soft.     Tenderness: There is abdominal tenderness.     Comments: Mild epigastric tenderness  Musculoskeletal:        General: No tenderness.     Cervical back: Normal range of motion and neck supple.  Skin:    General: Skin is warm and dry.  Neurological:     Mental Status: She is alert and oriented to person, place, and time.  Psychiatric:        Behavior: Behavior normal.     ED Results / Procedures / Treatments   Labs (all labs ordered are listed, but only abnormal results are displayed) Labs Reviewed  COMPREHENSIVE METABOLIC PANEL - Abnormal; Notable for the following components:      Result Value   Potassium 2.8 (*)    Chloride 95 (*)    Glucose, Bld 145 (*)    Calcium  8.8 (*)    All other components within normal limits  CBC - Abnormal; Notable for the following components:   RBC 5.68 (*)    MCV 73.4 (*)    MCH 24.5 (*)    All other components within normal limits  URINALYSIS, ROUTINE W REFLEX MICROSCOPIC - Abnormal; Notable for the following components:   Specific Gravity, Urine 1.033 (*)    Hgb urine dipstick SMALL (*)    Ketones, ur >80 (*)    Protein, ur >300 (*)    All other components within normal limits  LIPASE, BLOOD  PREGNANCY, URINE  MAGNESIUM     EKG None  Radiology No results found.  Procedures Procedures    Medications Ordered in ED Medications  potassium chloride  10 mEq in 100 mL IVPB (10 mEq Intravenous New Bag/Given 03/15/23 0235)  potassium chloride  (KLOR-CON ) packet 40 mEq (has no administration in time range)  droperidol  (INAPSINE ) 2.5 MG/ML injection 1.25 mg (1.25 mg Intravenous Given 03/15/23 0224)  diphenhydrAMINE  (BENADRYL ) injection 25 mg (25 mg Intravenous Given 03/15/23 0231)  morphine  (PF) 4 MG/ML injection 4 mg (4 mg Intravenous Given 03/15/23 0230)    ED Course/ Medical Decision  Making/ A&P                                 Medical Decision Making Amount and/or Complexity of Data Reviewed Labs: ordered.  Risk Prescription drug management.   53 yo F With a chief epigastric abdominal pain and intractable nausea and vomiting.  This is now the patient's third visit in the past 4 days.  She has not been  able to tolerate anything at home for her.  Lab work here with a low potassium level.  LFTs and lipase are unremarkable.  Patient has had CT imaging recently that was unremarkable.  Patient is feeling a bit better after IV fluids and antiemetics and pain medicine.  Able to tolerate by mouth.  She would like to try and go home.  PCP follow-up.  3:28 AM:  I have discussed the diagnosis/risks/treatment options with the patient and family.  Evaluation and diagnostic testing in the emergency department does not suggest an emergent condition requiring admission or immediate intervention beyond what has been performed at this time.  They will follow up with PCP. We also discussed returning to the ED immediately if new or worsening sx occur. We discussed the sx which are most concerning (e.g., sudden worsening pain, fever, inability to tolerate by mouth) that necessitate immediate return. Medications administered to the patient during their visit and any new prescriptions provided to the patient are listed below.  Medications given during this visit Medications  potassium chloride  10 mEq in 100 mL IVPB (10 mEq Intravenous New Bag/Given 03/15/23 0235)  potassium chloride  (KLOR-CON ) packet 40 mEq (has no administration in time range)  droperidol  (INAPSINE ) 2.5 MG/ML injection 1.25 mg (1.25 mg Intravenous Given 03/15/23 0224)  diphenhydrAMINE  (BENADRYL ) injection 25 mg (25 mg Intravenous Given 03/15/23 0231)  morphine  (PF) 4 MG/ML injection 4 mg (4 mg Intravenous Given 03/15/23 0230)     The patient appears reasonably screen and/or stabilized for discharge and I doubt any other medical  condition or other [facility] requiring further screening, evaluation, or treatment in the ED at this time prior to discharge.          Final Clinical Impression(s) / ED Diagnoses Final diagnoses:  Nausea and vomiting in adult  Influenza A    Rx / DC Orders ED Discharge Orders          Ordered    promethazine  (PHENERGAN ) 25 MG suppository  Every 6 hours PRN,   Status:  Discontinued        03/15/23 0315    promethazine  (PHENERGAN ) 25 MG suppository  Every 6 hours PRN        03/15/23 0316              Emil Share, DO 03/15/23 [id number]

## 2023-03-15 NOTE — Discharge Instructions (Signed)
 Take things easy at home.  Start with clear liquids and then moved to the brat diet.  Try the Zofran  if you develop nausea.  If that does not work I did prescribe you suppositories.  Please return to the ED for inability eat or drink.

## 2023-03-19 ENCOUNTER — Encounter (HOSPITAL_COMMUNITY): Payer: Self-pay

## 2023-03-19 ENCOUNTER — Emergency Department (HOSPITAL_COMMUNITY): Payer: No Typology Code available for payment source

## 2023-03-19 ENCOUNTER — Observation Stay (HOSPITAL_COMMUNITY)
Admission: EM | Admit: 2023-03-19 | Discharge: 2023-03-20 | Disposition: A | Discharge status: Home / Self Care | Payer: No Typology Code available for payment source | Attending: Internal Medicine | Admitting: Internal Medicine

## 2023-03-19 DIAGNOSIS — Z794 Long term (current) use of insulin: Secondary | ICD-10-CM | POA: Insufficient documentation

## 2023-03-19 DIAGNOSIS — Z7984 Long term (current) use of oral hypoglycemic drugs: Secondary | ICD-10-CM | POA: Diagnosis not present

## 2023-03-19 DIAGNOSIS — E1159 Type 2 diabetes mellitus with other circulatory complications: Secondary | ICD-10-CM | POA: Diagnosis present

## 2023-03-19 DIAGNOSIS — I10 Essential (primary) hypertension: Secondary | ICD-10-CM | POA: Insufficient documentation

## 2023-03-19 DIAGNOSIS — E1169 Type 2 diabetes mellitus with other specified complication: Secondary | ICD-10-CM | POA: Diagnosis not present

## 2023-03-19 DIAGNOSIS — R1013 Epigastric pain: Secondary | ICD-10-CM | POA: Insufficient documentation

## 2023-03-19 DIAGNOSIS — E876 Hypokalemia: Principal | ICD-10-CM | POA: Insufficient documentation

## 2023-03-19 DIAGNOSIS — E119 Type 2 diabetes mellitus without complications: Secondary | ICD-10-CM

## 2023-03-19 DIAGNOSIS — E871 Hypo-osmolality and hyponatremia: Secondary | ICD-10-CM | POA: Diagnosis not present

## 2023-03-19 DIAGNOSIS — E860 Dehydration: Secondary | ICD-10-CM

## 2023-03-19 DIAGNOSIS — Z7985 Long-term (current) use of injectable non-insulin antidiabetic drugs: Secondary | ICD-10-CM | POA: Diagnosis not present

## 2023-03-19 DIAGNOSIS — J101 Influenza due to other identified influenza virus with other respiratory manifestations: Secondary | ICD-10-CM | POA: Diagnosis not present

## 2023-03-19 DIAGNOSIS — T887XXA Unspecified adverse effect of drug or medicament, initial encounter: Secondary | ICD-10-CM | POA: Insufficient documentation

## 2023-03-19 DIAGNOSIS — E785 Hyperlipidemia, unspecified: Secondary | ICD-10-CM | POA: Diagnosis not present

## 2023-03-19 DIAGNOSIS — Z79899 Other long term (current) drug therapy: Secondary | ICD-10-CM | POA: Insufficient documentation

## 2023-03-19 DIAGNOSIS — I152 Hypertension secondary to endocrine disorders: Secondary | ICD-10-CM | POA: Diagnosis present

## 2023-03-19 DIAGNOSIS — Z6841 Body Mass Index (BMI) 40.0 and over, adult: Secondary | ICD-10-CM | POA: Insufficient documentation

## 2023-03-19 DIAGNOSIS — R112 Nausea with vomiting, unspecified: Secondary | ICD-10-CM | POA: Diagnosis present

## 2023-03-19 LAB — URINALYSIS, MICROSCOPIC (REFLEX)

## 2023-03-19 LAB — URINALYSIS, ROUTINE W REFLEX MICROSCOPIC
Glucose, UA: NEGATIVE mg/dL
Ketones, ur: >80 mg/dL — AB
Leukocytes,Ua: NEGATIVE
Nitrite: NEGATIVE
Protein, ur: NEGATIVE mg/dL
Specific Gravity, Urine: 1.015 (ref 1.005–1.030)
pH: 6.5 (ref 5.0–8.0)

## 2023-03-19 LAB — I-STAT CG4 LACTIC ACID, ED: Lactic Acid, Venous: 2.6 mmol/L (ref 0.5–1.9)

## 2023-03-19 LAB — COMPREHENSIVE METABOLIC PANEL
ALT: 32 U/L (ref 0–44)
AST: 26 U/L (ref 15–41)
Albumin: 3.8 g/dL (ref 3.5–5.0)
Alkaline Phosphatase: 60 U/L (ref 38–126)
Anion gap: 18 — ABNORMAL HIGH (ref 5–15)
BUN: 14 mg/dL (ref 6–20)
CO2: 21 mmol/L — ABNORMAL LOW (ref 22–32)
Calcium: 9.7 mg/dL (ref 8.9–10.3)
Chloride: 90 mmol/L — ABNORMAL LOW (ref 98–111)
Creatinine, Ser: 1.13 mg/dL — ABNORMAL HIGH (ref 0.44–1.00)
GFR, Estimated: 59 mL/min — ABNORMAL LOW (ref >60)
Glucose, Bld: 158 mg/dL — ABNORMAL HIGH (ref 70–99)
Potassium: 2.6 mmol/L — CL (ref 3.5–5.1)
Sodium: 129 mmol/L — ABNORMAL LOW (ref 135–145)
Total Bilirubin: 1.3 mg/dL — ABNORMAL HIGH (ref 0.0–1.2)
Total Protein: 7.3 g/dL (ref 6.5–8.1)

## 2023-03-19 LAB — CBC WITH DIFFERENTIAL/PLATELET
Abs Immature Granulocytes: 0.06 10*3/uL (ref 0.00–0.07)
Basophils Absolute: 0.1 10*3/uL (ref 0.0–0.1)
Basophils Relative: 0 %
Eosinophils Absolute: 0.1 10*3/uL (ref 0.0–0.5)
Eosinophils Relative: 1 %
HCT: 45.1 % (ref 36.0–46.0)
Hemoglobin: 14.7 g/dL (ref 12.0–15.0)
Immature Granulocytes: 1 %
Lymphocytes Relative: 28 %
Lymphs Abs: 3.2 10*3/uL (ref 0.7–4.0)
MCH: 24.1 pg — ABNORMAL LOW (ref 26.0–34.0)
MCHC: 32.6 g/dL (ref 30.0–36.0)
MCV: 74.1 fL — ABNORMAL LOW (ref 80.0–100.0)
Monocytes Absolute: 1 10*3/uL (ref 0.1–1.0)
Monocytes Relative: 9 %
Neutro Abs: 7 10*3/uL (ref 1.7–7.7)
Neutrophils Relative %: 61 %
Platelets: 403 10*3/uL — ABNORMAL HIGH (ref 150–400)
RBC: 6.09 MIL/uL — ABNORMAL HIGH (ref 3.87–5.11)
RDW: 13.3 % (ref 11.5–15.5)
WBC: 11.4 10*3/uL — ABNORMAL HIGH (ref 4.0–10.5)
nRBC: 0 % (ref 0.0–0.2)

## 2023-03-19 LAB — HEMOGLOBIN A1C
Hgb A1c MFr Bld: 7.5 % — ABNORMAL HIGH (ref 4.8–5.6)
Mean Plasma Glucose: 168.55 mg/dL

## 2023-03-19 LAB — MAGNESIUM: Magnesium: 2 mg/dL (ref 1.7–2.4)

## 2023-03-19 LAB — LIPASE, BLOOD: Lipase: 59 U/L — ABNORMAL HIGH (ref 11–51)

## 2023-03-19 MED ORDER — ATORVASTATIN CALCIUM 40 MG PO TABS
40.0000 mg | ORAL_TABLET | Freq: Every day | ORAL | Status: DC
  Administered 2023-03-19 – 2023-03-20 (×2): 40 mg via ORAL
  Filled 2023-03-19 (×2): qty 1

## 2023-03-19 MED ORDER — POTASSIUM CHLORIDE CRYS ER 20 MEQ PO TBCR
40.0000 meq | EXTENDED_RELEASE_TABLET | ORAL | Status: AC
  Administered 2023-03-19 – 2023-03-20 (×3): 40 meq via ORAL
  Filled 2023-03-19 (×3): qty 2

## 2023-03-19 MED ORDER — INSULIN ASPART 100 UNIT/ML IJ SOLN
0.0000 [IU] | Freq: Three times a day (TID) | INTRAMUSCULAR | Status: DC
  Administered 2023-03-20: 3 [IU] via SUBCUTANEOUS

## 2023-03-19 MED ORDER — FENTANYL CITRATE PF 50 MCG/ML IJ SOSY
50.0000 ug | PREFILLED_SYRINGE | Freq: Once | INTRAMUSCULAR | Status: AC
Start: 2023-03-19 — End: 2023-03-19
  Administered 2023-03-19: 50 ug via INTRAVENOUS
  Filled 2023-03-19: qty 1

## 2023-03-19 MED ORDER — INSULIN ASPART 100 UNIT/ML IJ SOLN: 0.0000 [IU] | Freq: Every day | INTRAMUSCULAR | Status: DC

## 2023-03-19 MED ORDER — LACTATED RINGERS IV SOLN
INTRAVENOUS | Status: DC
  Administered 2023-03-20: 100 mL/h via INTRAVENOUS

## 2023-03-19 MED ORDER — LACTATED RINGERS IV BOLUS
2000.0000 mL | Freq: Once | INTRAVENOUS | Status: AC
  Administered 2023-03-19: 2000 mL via INTRAVENOUS

## 2023-03-19 MED ORDER — ONDANSETRON HCL 4 MG/2ML IJ SOLN
4.0000 mg | Freq: Once | INTRAMUSCULAR | Status: AC
  Administered 2023-03-19: 4 mg via INTRAVENOUS
  Filled 2023-03-19: qty 2

## 2023-03-19 MED ORDER — IOHEXOL 350 MG/ML SOLN
75.0000 mL | Freq: Once | INTRAVENOUS | Status: AC | PRN
  Administered 2023-03-19: 75 mL via INTRAVENOUS

## 2023-03-19 MED ORDER — POTASSIUM CHLORIDE CRYS ER 20 MEQ PO TBCR
20.0000 meq | EXTENDED_RELEASE_TABLET | Freq: Once | ORAL | Status: AC
  Administered 2023-03-19: 20 meq via ORAL
  Filled 2023-03-19: qty 1

## 2023-03-19 MED ORDER — PANTOPRAZOLE SODIUM 40 MG PO TBEC
40.0000 mg | DELAYED_RELEASE_TABLET | Freq: Two times a day (BID) | ORAL | Status: DC
  Administered 2023-03-19 – 2023-03-20 (×2): 40 mg via ORAL
  Filled 2023-03-19 (×2): qty 1

## 2023-03-19 MED ORDER — MAGNESIUM SULFATE 2 GM/50ML IV SOLN
2.0000 g | Freq: Once | INTRAVENOUS | Status: AC
  Administered 2023-03-19: 2 g via INTRAVENOUS
  Filled 2023-03-19: qty 50

## 2023-03-19 MED ORDER — SODIUM CHLORIDE 0.9 % IV SOLN
12.5000 mg | Freq: Four times a day (QID) | INTRAVENOUS | Status: DC
  Administered 2023-03-20 (×2): 12.5 mg via INTRAVENOUS
  Filled 2023-03-19: qty 0.5
  Filled 2023-03-19: qty 12.5
  Filled 2023-03-19 (×2): qty 0.5

## 2023-03-19 MED ORDER — ACETAMINOPHEN 500 MG PO TABS: 1000.0000 mg | ORAL_TABLET | Freq: Four times a day (QID) | ORAL | Status: DC | PRN

## 2023-03-19 MED ORDER — ACETAMINOPHEN 650 MG RE SUPP: 650.0000 mg | Freq: Four times a day (QID) | RECTAL | Status: DC | PRN

## 2023-03-19 MED ORDER — HEPARIN SODIUM (PORCINE) 5000 UNIT/ML IJ SOLN
5000.0000 [IU] | Freq: Three times a day (TID) | INTRAMUSCULAR | Status: DC
  Administered 2023-03-19 – 2023-03-20 (×2): 5000 [IU] via SUBCUTANEOUS
  Filled 2023-03-19 (×2): qty 1

## 2023-03-19 MED ORDER — ACETAMINOPHEN 500 MG PO TABS
1000.0000 mg | ORAL_TABLET | Freq: Once | ORAL | Status: AC
  Administered 2023-03-19: 1000 mg via ORAL
  Filled 2023-03-19: qty 2

## 2023-03-19 MED ORDER — FAMOTIDINE IN NACL 20-0.9 MG/50ML-% IV SOLN
20.0000 mg | Freq: Once | INTRAVENOUS | Status: AC
  Administered 2023-03-19: 20 mg via INTRAVENOUS
  Filled 2023-03-19: qty 50

## 2023-03-19 MED ORDER — POTASSIUM CHLORIDE 10 MEQ/100ML IV SOLN
10.0000 meq | INTRAVENOUS | Status: AC
  Administered 2023-03-19 (×3): 10 meq via INTRAVENOUS
  Filled 2023-03-19 (×2): qty 100

## 2023-03-19 NOTE — ED Provider Notes (Signed)
 [place] EMERGENCY DEPARTMENT AT [facility] Provider Note  MDM   HPI/ROS:  [name] is a 53 y.o. female with pertinent past medical history of type 2 diabetes, HTN who presents for evaluation of abdominal pain.  Patient was initially diagnosed with the flu on 03/11/2023.  She has now had 4 visits for nausea, vomiting, and malaise.  During last visit on 03/15/2023 labs showed potassium 2.8, chloride 95, and UA consistent with dehydration.  She additionally had a CT on 03/11/2023 which showed cholelithiasis but without any acute intra-abdominal findings.  She felt improved after receiving IV fluids and antiemetics and elected for discharge home with PCP follow-up.  Patient notes since last night she has had progressively worsening stabbing epigastric abdominal pain that is now constant.  Last episode of vomiting was yesterday, but she reports persistent nausea.  Previously was having 10-12 episodes of vomiting daily.  She additionally endorses ongoing cough, congestion, lightheadedness, dizziness and she denies any urinary symptoms, chest pain, dyspnea, fevers.  Had a small bowel movement yesterday.  She has tolerated minimal p.o. intake over this time.  Physical exam is notable for: - Epigastric abdominal tenderness palpation without rebound or guarding -Negative Murphy sign -- Dry mucous membranes with prolonged capillary refill -- No CVA tenderness to palpation  On my initial evaluation, patient is:  -Vital signs stable. Patient afebrile, tachycardic but otherwise hemodynamically stable, and non-toxic appearing. -Additional history obtained from review of prior records -Labs reviewed: WBC 11.4, sodium 129, potassium 2.6, chloride 98, CO2 21, AG 18, creatinine 1.13, T. bili 1.3, lipase 59 -UA with trace blood, moderate bilirubin, ketonuria -Imaging reviewed: Chest x-ray with no evidence of pneumonia, pneumothorax  Differential diagnoses include influenza A infection, dehydration,  gastritis, pancreatitis, cholecystitis.  Initial workup as above with no evidence of pneumonia.  Labs demonstrating mild leukocytosis, suspect likely mixed acid-base disorder with hypokalemic hypochloremic metabolic alkalosis as well as starvation ketosis.  She has mild AKI and UA consistent with dehydration.  Overall suspect patient's worsening abdominal pain most likely in the setting of ongoing GI symptoms from her influenza illness, however labs today do have mildly increased lipase (not meeting criteria for pancreatitis) and T. bili when compared to previous labs and prior CT did demonstrate cholelithiasis.  Thus we will plan to obtain repeat CT abdomen/pelvis today to better evaluate for possible pancreatitis or cholecystitis, though overall much lower suspicion for these.  Patient given IV fluid resuscitation and electrolyte repletion.  EKG was additionally obtained and demonstrated sinus tachycardia but no acute ischemic changes.   Interpretations, interventions, and the patient's course of care are documented below.    -Heart rate improved to the 90s after receiving IV fluids -CT abdomen/pelvis similar to prior with no evidence of pancreatitis, cholecystitis, or other acute intra-abdominal pathology -Patient felt to require admission for further rehydration and monitoring given significant dehydration with associated metabolic derangements and failed outpatient management -Patient remained stable and had no acute events while in my care in the emergency department  Disposition:  I discussed the case with Dr. Laurence with the hospitalist team who graciously agreed to admit the patient to their service for continued care.  Please see their note for further treatment plan details.  Clinical Impression:  1. Influenza A   2. Dehydration   3. Hypokalemia     Rx / DC Orders ED Discharge Orders     None       The plan for this patient was discussed with Dr. Dasie,  who voiced agreement and  who oversaw evaluation and treatment of this patient.   Clinical Complexity A medically appropriate history, review of systems, and physical exam was performed.  My independent interpretations of EKG, labs, and radiology are documented in the ED course above.   If decision rules were used in this patient's evaluation, they are listed below.   Patient's presentation is most consistent with acute presentation with potential threat to life or bodily function.  Medical Decision Making Amount and/or Complexity of Data Reviewed Radiology: ordered.  Risk OTC drugs. Prescription drug management. Decision regarding hospitalization.    HPI/ROS      See MDM section for pertinent HPI and ROS. A complete ROS was performed with pertinent positives/negatives noted above.   Past Medical History:  Diagnosis Date   Abnormal Pap smear 2007   colpo   Complication of anesthesia    Takes awhile for patient to awake.   Cystic fibrosis carrier    FHx: heart disease    FHx: hypertension    FHx: scoliosis    H/O candidiasis    H/O chlamydia infection 2006   treated x 1   Hx: UTI (urinary tract infection)    x 1   Hyperlipidemia    Hypertension    Increased BMI    Obesity    Vulvitis 04/27/06    Past Surgical History:  Procedure Laterality Date   CESAREAN SECTION     x2   WISDOM TOOTH EXTRACTION        Physical Exam   Vitals:   03/19/23 1402 03/19/23 1411  BP: (!) 126/91   Pulse: (!) 122   Resp: 20   Temp: 98.3 F (36.8 C)   TempSrc: Oral   SpO2: 100%   Weight:  109.8 kg  Height:  5' 4 (1.626 m)    Physical Exam Gen: NAD. Appears comfortable HENT: Conjunctiva clear, PERRL, EOMI. dry mucous membranes.  CV: RRR. No M/R/G Pulm: Lungs CTAB with no wheezing, rales, or rhonchi.  GI: Abdomen soft, epigastric abdominal tenderness palpation without rebound or guarding.  Non-distended. Normal bowel sounds in all 4 quadrants.  Negative Murphy sign.  No CVA tenderness to palpation  bilaterally MSK/Skin: No lower extremity edema. Extremities warm, well-perfused with 2+ pulses in all 4 extremities.  Prolonged capillary refill Neuro: A&Ox3. GCS 15. Moves all extremities.     Procedures   If procedures were preformed on this patient, they are listed below:  Procedures   Laverda Rimes, MD Emergency Medicine PGY-2   Please note that this documentation was produced with the assistance of voice-to-text technology and may contain errors.    Rimes Laverda, MD 03/20/23 9955    Dasie Faden, MD 03/20/23 [phone]

## 2023-03-19 NOTE — Assessment & Plan Note (Signed)
 She is outside the window for Tamiflu.  Continue supportive care.

## 2023-03-19 NOTE — Assessment & Plan Note (Signed)
 Hold blood pressure medications for now.  As needed labetalol for systolic blood pressures greater than 170 or diastolic greater than 100

## 2023-03-19 NOTE — ED Provider Notes (Signed)
 I saw and evaluated the patient, reviewed the resident's note and I agree with the findings and plan.   53 year old female presents with recurrent nausea vomiting.  Diagnosed with influenza.  Seen here multiple times for same.  Has evidence of hypokalemia as well as dehydration.  Due to failed outpatient treatment she will require admission.   Dasie Faden, MD 03/19/23 [phone]

## 2023-03-19 NOTE — Assessment & Plan Note (Signed)
 -  Continue Lipitor 40 mg daily ?

## 2023-03-19 NOTE — ED Provider Triage Note (Signed)
 Emergency Medicine Provider Triage Evaluation Note  [name] , a 53 y.o. female  was evaluated in triage.  Pt complains of abdominal pain x 1 week.  Review of Systems  Positive: Fatigue, cough, congestion, epigastric pain, nausea Negative: Fever, chills, chest pain, shortness of breath  Physical Exam  BP (!) 126/91 (BP Location: Right Arm)   Pulse (!) 122   Temp 98.3 F (36.8 C) (Oral)   Resp 20   Ht 5' 4 (1.626 m)   Wt 109.8 kg   SpO2 100%   BMI 41.54 kg/m  Gen:   Awake, no distress   Resp:  Normal effort  MSK:   Moves extremities without difficulty  Other:    Medical Decision Making  Medically screening exam initiated at 2:19 PM.  Appropriate orders placed.  [name] was informed that the remainder of the evaluation will be completed by another provider, this initial triage assessment does not replace that evaluation, and the importance of remaining in the ED until their evaluation is complete.  Labs and imaging ordered   Francis Ileana SAILOR, PA-C 03/19/23 1422

## 2023-03-19 NOTE — Hospital Course (Signed)
 HPI: 53 year old Afro-American female history of class III obesity BMI of 41.5, essential hypertension, type 2 diabetes, hyperlipidemia who presents to the ER today with about a 2-week history of nausea and vomiting.  She was recently seen in the PCP clinic on January 22.  She has been on Ozempic  for well over a year.  She states that she has not lost any weight but is only maintained her weight.  Her PCP switched her over to Mounjaro .  She normally takes Ozempic  on Thursdays and her last dose of Ozempic  was on January 16.  Her Louellen was ordered during her office visit on the 22nd and she took her initial dose on Tuesday, January 28.  She states that she did not think that she gave herself the injection correctly on the 28th and proceeded to give herself a second injection of 10 mg of Mounjaro .  Only after giving herself the second injection did she realize that she indeed gave herself the injection correctly.  She therefore gave herself a total of 20 mg of Mounjaro  as her initial dose.  She started having some fever and chills on Wednesday the 29th.  By Saturday, February 1, she had been having nausea and vomiting for 3 days.  She was seen in the ER and diagnosed with influenza on 03-11-2023.  She was  Discharged home with supportive care.  She came back to the ER on the third with nausea and vomiting.  She was given fluids and discharged to home again.  She was seen again in the ER on 03/15/2023 for similar symptoms of nausea and vomiting.  She was given IV fluids and discharged to home again.  She reports back to the ER tonight with the same symptoms.  She is no longer having diarrhea.  She still having nausea.  She has not had any fever.  There is no shortness of breath.  Only an intermittent cough.  On arrival temp 98.3 heart rate 122 blood pressure 126/91 satting 100% on room air.  White count 11.4, hemoglobin 14.7, platelets of 403  UA showed ketones greater than 80, negative nitrate, negative  leukocyte esterase.  Sodium 129, potassium 2.6, chloride of 90, bicarb 21, BUN of 14, creatinine 1.1, glucose 158  Total protein 9.7, albumin 3.8, AST 26, ALT 32, alk phos of 60, total bili 1.3  Magnesium  2.0, lipase 59  Lactic acid 2.6  Chest x-ray showed no acute cardiopulmonary disease  CT abdomen pelvis showed cholelithiasis without evidence of acute cholecystitis.  She has an IUD.  Patient received IV fluids, p.o. and IV KCl.  [facility] hospitalist consulted for admission.  Significant Events: Admitted 03/19/2023 for hypokalemia, hyponatremia   Significant Labs: White count 11.4, hemoglobin 14.7, platelets of 403 UA showed ketones greater than 80, negative nitrate, negative leukocyte esterase. Sodium 129, potassium 2.6, chloride of 90, bicarb 21, BUN of 14, creatinine 1.1, glucose 158 Total protein 9.7, albumin 3.8, AST 26, ALT 32, alk phos of 60, total bili 1.3 Magnesium  2.0, lipase 59 Lactic acid 2.6  Significant Imaging Studies: Chest x-ray showed no acute cardiopulmonary disease CT abdomen pelvis showed cholelithiasis without evidence of acute cholecystitis.  She has an IUD.  Antibiotic Therapy: Anti-infectives (From admission, onward)    None       Procedures:   Consultants:

## 2023-03-19 NOTE — Assessment & Plan Note (Signed)
 Hypovolemic hyponatremia.  Continue with IV fluids.  Due to poor p.o. intake and poor solute intake.  Given IV fluid shortage, will have to use LR as normal saline with potassium is on critical shortage.

## 2023-03-19 NOTE — Assessment & Plan Note (Signed)
 Patient gave her self 20 mg of Mounjaro on March 07, 2023.  It may be another 2 to 3 weeks for this medication to wear off because she was also taking concomitant Ozempic .

## 2023-03-19 NOTE — ED Triage Notes (Signed)
 Pt to ED c/o ongoing abdominal pain ,fatigue for over 1 week, reports recently dx with flu, evaluated for the same x 6 in the ED. Reports not getting better, but vomiting has resolved.

## 2023-03-19 NOTE — Assessment & Plan Note (Signed)
 Start IV Protonix  twice daily.  Patient likely has some gastritis from her Mounjaro.

## 2023-03-19 NOTE — Assessment & Plan Note (Signed)
 Add sliding scale insulin  only hold Glucophage 

## 2023-03-19 NOTE — H&P (Signed)
 History and Physical    [name] FMW:[id number] DOB: May 29, 1970 DOA: 03/19/2023  DOS: the patient was seen and examined on 03/19/2023  PCP: Zollie Lowers, MD   Patient coming from: Home  I have personally briefly reviewed patient's old medical records in [facility]  HPI: 53 year old Afro-American female history of class III obesity BMI of 41.5, essential hypertension, type 2 diabetes, hyperlipidemia who presents to the ER today with about a 2-week history of nausea and vomiting.  She was recently seen in the PCP clinic on January 22.  She has been on Ozempic  for well over a year.  She states that she has not lost any weight but is only maintained her weight.  Her PCP switched her over to Mounjaro .  She normally takes Ozempic  on Thursdays and her last dose of Ozempic  was on January 16.  Her Louellen was ordered during her office visit on the 22nd and she took her initial dose on Tuesday, January 28.  She states that she did not think that she gave herself the injection correctly on the 28th and proceeded to give herself a second injection of 10 mg of Mounjaro .  Only after giving herself the second injection did she realize that she indeed gave herself the injection correctly.  She therefore gave herself a total of 20 mg of Mounjaro  as her initial dose.  She started having some fever and chills on Wednesday the 29th.  By Saturday, February 1, she had been having nausea and vomiting for 3 days.  She was seen in the ER and diagnosed with influenza on 03-11-2023.  She was  Discharged home with supportive care.  She came back to the ER on the third with nausea and vomiting.  She was given fluids and discharged to home again.  She was seen again in the ER on 03/15/2023 for similar symptoms of nausea and vomiting.  She was given IV fluids and discharged to home again.  She reports back to the ER tonight with the same symptoms.  She is no longer having diarrhea.  She still having nausea.  She has not  had any fever.  There is no shortness of breath.  Only an intermittent cough.  On arrival temp 98.3 heart rate 122 blood pressure 126/91 satting 100% on room air.  White count 11.4, hemoglobin 14.7, platelets of 403  UA showed ketones greater than 80, negative nitrate, negative leukocyte esterase.  Sodium 129, potassium 2.6, chloride of 90, bicarb 21, BUN of 14, creatinine 1.1, glucose 158  Total protein 9.7, albumin 3.8, AST 26, ALT 32, alk phos of 60, total bili 1.3  Magnesium  2.0, lipase 59  Lactic acid 2.6  Chest x-ray showed no acute cardiopulmonary disease  CT abdomen pelvis showed cholelithiasis without evidence of acute cholecystitis.  She has an IUD.  Patient received IV fluids, p.o. and IV KCl.  [facility] consulted for admission.  Significant Events: Admitted 03/19/2023 for hypokalemia, hyponatremia   Significant Labs: White count 11.4, hemoglobin 14.7, platelets of 403 UA showed ketones greater than 80, negative nitrate, negative leukocyte esterase. Sodium 129, potassium 2.6, chloride of 90, bicarb 21, BUN of 14, creatinine 1.1, glucose 158 Total protein 9.7, albumin 3.8, AST 26, ALT 32, alk phos of 60, total bili 1.3 Magnesium  2.0, lipase 59 Lactic acid 2.6  Significant Imaging Studies: Chest x-ray showed no acute cardiopulmonary disease CT abdomen pelvis showed cholelithiasis without evidence of acute cholecystitis.  She has an IUD.  Antibiotic Therapy: Anti-infectives (From  admission, onward)    None       Procedures:   Consultants:     ED Course: Patient received IV fluids, IV KCl and p.o. KCl, IV magnesium .  CT abdomen pelvis negative for acute intra-abdominal pathology.  Review of Systems:  Review of Systems  Constitutional: Negative.   HENT: Negative.    Eyes: Negative.   Respiratory: Negative.    Cardiovascular: Negative.   Gastrointestinal:  Positive for abdominal pain, nausea and vomiting.  Genitourinary: Negative.    Musculoskeletal: Negative.   Skin: Negative.   Neurological: Negative.   Endo/Heme/Allergies: Negative.   Psychiatric/Behavioral: Negative.    All other systems reviewed and are negative.   Past Medical History:  Diagnosis Date   Abnormal Pap smear 2007   colpo   Complication of anesthesia    Takes awhile for patient to awake.   Cystic fibrosis carrier    FHx: heart disease    FHx: hypertension    FHx: scoliosis    H/O candidiasis    H/O chlamydia infection 2006   treated x 1   Hx: UTI (urinary tract infection)    x 1   Hyperlipidemia    Hypertension    Increased BMI    Obesity    Vulvitis 04/27/06    Past Surgical History:  Procedure Laterality Date   CESAREAN SECTION     x2   WISDOM TOOTH EXTRACTION       reports that she has never smoked. She has never used smokeless tobacco. She reports that she does not drink alcohol and does not use drugs.  Allergies  Allergen Reactions   Oxycodone-Acetaminophen  Nausea And Vomiting and Other (See Comments)   Percocet [Oxycodone-Acetaminophen ] Nausea And Vomiting    Family History  Problem Relation Age of Onset   Heart disease Mother    Hypertension Mother    Diabetes Mother    Hypertension Sister    Alcohol abuse Maternal Uncle    Mental illness Paternal Grandmother        alzheimer's   Diabetes Paternal Grandmother     Prior to Admission medications   Medication Sig Start Date End Date Taking? Authorizing Provider  atorvastatin  (LIPITOR) 40 MG tablet Take 1 tablet (40 mg total) by mouth daily. For cholesterol 03/01/23   Zollie Lowers, MD  Blood Glucose Monitoring Suppl (ONETOUCH VERIO REFLECT) w/Device KIT CHECK BLOOD SUGAR 3 TIMES  DAILY AS DIRECTED 10/13/21   Zollie Lowers, MD  cholecalciferol (VITAMIN D3) 25 MCG (1000 UNIT) tablet Take 2,000 Units by mouth daily.    [provider]  dicyclomine (BENTYL) 10 MG capsule Take 10 mg by mouth in the morning, at noon, and at bedtime. 03/11/23   [provider]  glucose blood (ONETOUCH VERIO) test strip TEST BLOOD SUGAR 3 TIMES  DAILY Dx E11.9 07/26/21   Zollie Lowers, MD  hydrochlorothiazide  (HYDRODIURIL ) 25 MG tablet Take 0.5-1 tablets (12.5-25 mg total) by mouth daily as needed (swelling). Patient taking differently: Take 25 mg by mouth daily as needed (swelling). 08/15/22   Zollie Lowers, MD  Lancets [facility] DELICA PLUS VARA) MISC TEST BLOOD SUGAR 3 TIMES  DAILY Dx E11.9 07/26/21   Zollie Lowers, MD  levonorgestrel (MIRENA) 20 MCG/24HR IUD Mirena 20 mcg/24 hours (5 yrs) 52 mg intrauterine device  Take 1 device by intrauterine route.    [provider]  lisinopril  (ZESTRIL ) 30 MG tablet Take 1 tablet (30 mg total) by mouth daily. 03/01/23   Zollie Lowers, MD  metFORMIN  (GLUCOPHAGE -XR)  500 MG 24 hr tablet Take 2 tablets (1,000 mg total) by mouth daily with breakfast. 03/01/23   Zollie Lowers, MD  ondansetron  (ZOFRAN -ODT) 4 MG disintegrating tablet Take 4 mg by mouth every 8 (eight) hours as needed for nausea or vomiting. 03/11/23   [provider]  potassium chloride  SA (KLOR-CON  M) 20 MEQ tablet Take 1 tablet (20 mEq total) by mouth 2 (two) times daily for 3 days. 03/13/23 03/16/23  Victor Lynwood DASEN, PA-C  promethazine  (PHENERGAN ) 25 MG suppository Place 1 suppository (25 mg total) rectally every 6 (six) hours as needed for nausea or vomiting. 03/15/23   Emil Share, DO  Semaglutide , 2 MG/DOSE, (OZEMPIC , 2 MG/DOSE,) 8 MG/3ML SOPN Inject 2 mg into the skin once a week. Patient not taking: Reported on 03/13/2023 03/01/23   Zollie Lowers, MD  tirzepatide  (MOUNJARO ) 10 MG/0.5ML Pen Inject 10 mg into the skin once a week. 03/01/23   Zollie Lowers, MD    Physical Exam: Vitals:   03/19/23 1745 03/19/23 1845 03/19/23 1930 03/19/23 1945  BP: 112/77 (!) 123/95 128/84 (!) 127/46  Pulse: (!) 111 97 95 97  Resp: 15 17 15 17   Temp:  98 F (36.7 C)    TempSrc:  Oral    SpO2: 100% 100% 100% 98%  Weight:      Height:         Physical Exam Vitals and nursing note reviewed.  Constitutional:      General: She is not in acute distress.    Appearance: She is obese. She is not toxic-appearing or diaphoretic.  HENT:     Head: Normocephalic and atraumatic.     Nose: Nose normal.  Eyes:     General: No scleral icterus. Cardiovascular:     Rate and Rhythm: Normal rate and regular rhythm.     Pulses: Normal pulses.  Pulmonary:     Effort: Pulmonary effort is normal.     Breath sounds: Normal breath sounds.  Abdominal:     General: Abdomen is protuberant. Bowel sounds are normal. There is no distension.     Palpations: Abdomen is soft.     Tenderness: There is abdominal tenderness in the epigastric area.     Comments: Mild epigastric tenderness. No rebound or guarding  Musculoskeletal:     Right lower leg: No edema.     Left lower leg: No edema.  Skin:    General: Skin is warm and dry.     Capillary Refill: Capillary refill takes less than 2 seconds.  Neurological:     General: No focal deficit present.     Mental Status: She is alert and oriented to person, place, and time.      Labs on Admission: I have personally reviewed following labs and imaging studies  CBC: Recent Labs  Lab 03/13/23 0752 03/14/23 1910 03/19/23 1429  WBC 10.0 9.9 11.4*  NEUTROABS 6.4  --  7.0  HGB 13.5 13.9 14.7  HCT 42.4 41.7 45.1  MCV 75.8* 73.4* 74.1*  PLT 355 339 403*   Basic Metabolic Panel: Recent Labs  Lab 03/13/23 0752 03/14/23 1910 03/15/23 0212 03/19/23 1429  NA 137 135  --  129*  K 3.1* 2.8*  --  2.6*  CL 101 95*  --  90*  CO2 22 26  --  21*  GLUCOSE 213* 145*  --  158*  BUN 14 15  --  14  CREATININE 0.91 0.75  --  1.13*  CALCIUM  8.4* 8.8*  --  9.7  MG  --   --  2.0 2.0   GFR: Estimated Creatinine Clearance: 70.5 mL/min (A) (by C-G formula based on SCr of 1.13 mg/dL (H)). Liver Function Tests: Recent Labs  Lab 03/13/23 0752 03/14/23 1910 03/19/23 1429  AST 36 23 26  ALT 35 26 32   ALKPHOS 71 71 60  BILITOT 0.5 0.8 1.3*  PROT 7.0 7.9 7.3  ALBUMIN 3.6 4.4 3.8   Recent Labs  Lab 03/13/23 0752 03/14/23 1910 03/19/23 1429  LIPASE 55* 41 59*   Urine analysis:    Component Value Date/Time   COLORURINE YELLOW 03/19/2023 1436   APPEARANCEUR CLEAR 03/19/2023 1436   APPEARANCEUR Hazy (A) 05/12/2022 1402   LABSPEC 1.015 03/19/2023 1436   PHURINE 6.5 03/19/2023 1436   GLUCOSEU NEGATIVE 03/19/2023 1436   HGBUR TRACE (A) 03/19/2023 1436   BILIRUBINUR MODERATE (A) 03/19/2023 1436   BILIRUBINUR Negative 05/12/2022 1402   KETONESUR >80 (A) 03/19/2023 1436   PROTEINUR NEGATIVE 03/19/2023 1436   NITRITE NEGATIVE 03/19/2023 1436   LEUKOCYTESUR NEGATIVE 03/19/2023 1436    Radiological Exams on Admission: I have personally reviewed images CT ABDOMEN PELVIS W CONTRAST Result Date: 03/19/2023 CLINICAL DATA:  Epigastric abdominal pain, fatigue for 1 week EXAM: CT ABDOMEN AND PELVIS WITH CONTRAST TECHNIQUE: Multidetector CT imaging of the abdomen and pelvis was performed using the standard protocol following bolus administration of intravenous contrast. RADIATION DOSE REDUCTION: This exam was performed according to the departmental dose-optimization program which includes automated exposure control, adjustment of the mA and/or kV according to patient size and/or use of iterative reconstruction technique. CONTRAST:  75mL OMNIPAQUE  IOHEXOL  350 MG/ML SOLN COMPARISON:  03/11/2023 FINDINGS: Lower chest: No acute pleural or parenchymal lung disease. Hepatobiliary: Focal fatty infiltration along the falciform ligament. No other focal liver abnormalities. Multiple small calcified gallstones without evidence of acute cholecystitis. No biliary duct dilation. Pancreas: Unremarkable. No pancreatic ductal dilatation or surrounding inflammatory changes. Spleen: Normal in size without focal abnormality. Adrenals/Urinary Tract: Adrenal glands are unremarkable. Kidneys are normal, without renal calculi,  focal lesion, or hydronephrosis. Bladder is unremarkable. Stomach/Bowel: No bowel obstruction or ileus. Normal appendix right lower quadrant. No bowel wall thickening or inflammatory change. Vascular/Lymphatic: Aortic atherosclerosis. No enlarged abdominal or pelvic lymph nodes. Reproductive: IUD again noted within a retroverted uterus. 2 cm simple cyst or dominant follicle right ovary again noted, no specific imaging follow-up recommended. No left adnexal mass. Other: No free fluid or free intraperitoneal gas. No abdominal wall hernia. Musculoskeletal: No acute or destructive bony abnormalities. Reconstructed images demonstrate no additional findings. IMPRESSION: 1. Cholelithiasis without evidence of cholecystitis. 2. Otherwise no acute intra-abdominal or intrapelvic process. 3.  Aortic Atherosclerosis (ICD10-I70.0). Electronically Signed   By: Ozell Daring M.D.   On: 03/19/2023 18:21   DG Chest 2 View Result Date: 03/19/2023 CLINICAL DATA:  Cough and fatigue. EXAM: CHEST - 2 VIEW COMPARISON:  None Available. FINDINGS: The heart size and mediastinal contours are within normal limits. Both lungs are clear. The visualized skeletal structures are unremarkable. IMPRESSION: No active cardiopulmonary disease. Electronically Signed   By: Elsie ONEIDA Shoulder M.D.   On: 03/19/2023 14:52   EKG: My personal interpretation of EKG shows: sinus tachycardia   Assessment/Plan Principal Problem:   Hypokalemia Active Problems:   Hyponatremia   Side effect of medication - GLP-1   Dyspepsia   Hypertension associated with diabetes (HCC)   Obesity, Class III, BMI 40-49.9 (morbid obesity) (HCC)   Hyperlipidemia associated with type 2 diabetes mellitus (HCC)  Type 2 diabetes mellitus without complication, without long-term current use of insulin  (HCC)   Influenza A    Assessment and Plan: * Hypokalemia Admit to med telemetry bed.  Continue with IV and p.o. replacement of KCl.  Continue with IV fluids.  I think her  hyponatremia, hypovolemic hyponatremia and her nausea vomiting are all related to the patient giving her self 2 injections of Mounjaro  on the same day.  Her symptoms are not consistent with influenza.  She likely has various slow gastric motility now due to this large dose of Mounjaro .  Discussed with the patient it may take several weeks for this medication to wear off.  Continue with scheduled Phenergan  every 6 hours to help with the nausea.  Placed on clear liquids.  Patient may have to go home on a full liquid diet as able to take several weeks for this Mounjaro  to wear off.  She does not have sepsis.  Dyspepsia Start IV Protonix  twice daily.  Patient likely has some gastritis from her Mounjaro .  Side effect of medication - GLP-1 Patient gave her self 20 mg of Mounjaro  on March 07, 2023.  It may be another 2 to 3 weeks for this medication to wear off because she was also taking concomitant Ozempic .  Hyponatremia Hypovolemic hyponatremia.  Continue with IV fluids.  Due to poor p.o. intake and poor solute intake.  Given IV fluid shortage, will have to use LR as normal saline with potassium is on critical shortage.  Influenza A She is outside the window for Tamiflu.  Continue supportive care.  Type 2 diabetes mellitus without complication, without long-term current use of insulin  (HCC) Add sliding scale insulin  only hold Glucophage   Hyperlipidemia associated with type 2 diabetes mellitus (HCC) Continue Lipitor 40 mg daily  Morbid obesity with BMI of 40.0-44.9, adult (HCC) Estimated body mass index is 41.54 kg/m as calculated from the following:   Height as of this encounter: 5' 4 (1.626 m).   Weight as of this encounter: 109.8 kg.   Obesity, Class III, BMI 40-49.9 (morbid obesity) (HCC) Estimated body mass index is 41.54 kg/m as calculated from the following:   Height as of this encounter: 5' 4 (1.626 m).   Weight as of this encounter: 109.8 kg.   Hypertension associated with  diabetes (HCC) Hold blood pressure medications for now.  As needed labetalol for systolic blood pressures greater than 170 or diastolic greater than 100   DVT prophylaxis: SQ Heparin  Code Status: Full Code Family Communication: no family at bedside. Pt is decisional.  Disposition Plan: return home  Consults called: none  Admission status: Observation, Telemetry bed   Camellia Door, DO [facility] 03/19/2023, 9:02 PM

## 2023-03-19 NOTE — Assessment & Plan Note (Signed)
 Estimated body mass index is 41.54 kg/m as calculated from the following:   Height as of this encounter: 5\' 4"  (1.626 m).   Weight as of this encounter: 109.8 kg.

## 2023-03-19 NOTE — Assessment & Plan Note (Signed)
 Admit to med telemetry bed.  Continue with IV and p.o. replacement of KCl.  Continue with IV fluids.  I think her hyponatremia, hypovolemic hyponatremia and her nausea vomiting are all related to the patient giving her self 2 injections of Mounjaro  on the same day.  Her symptoms are not consistent with influenza.  She likely has various slow gastric motility now due to this large dose of Mounjaro .  Discussed with the patient it may take several weeks for this medication to wear off.  Continue with scheduled Phenergan  every 6 hours to help with the nausea.  Placed on clear liquids.  Patient may have to go home on a full liquid diet as able to take several weeks for this Mounjaro  to wear off.  She does not have sepsis.

## 2023-03-20 ENCOUNTER — Other Ambulatory Visit: Payer: Self-pay

## 2023-03-20 DIAGNOSIS — E876 Hypokalemia: Secondary | ICD-10-CM | POA: Diagnosis not present

## 2023-03-20 LAB — CBG MONITORING, ED
Glucose-Capillary: 109 mg/dL — ABNORMAL HIGH (ref 70–99)
Glucose-Capillary: 153 mg/dL — ABNORMAL HIGH (ref 70–99)
Glucose-Capillary: 83 mg/dL (ref 70–99)

## 2023-03-20 LAB — COMPREHENSIVE METABOLIC PANEL
ALT: 26 U/L (ref 0–44)
AST: 21 U/L (ref 15–41)
Albumin: 3.3 g/dL — ABNORMAL LOW (ref 3.5–5.0)
Alkaline Phosphatase: 51 U/L (ref 38–126)
Anion gap: 11 (ref 5–15)
BUN: 9 mg/dL (ref 6–20)
CO2: 24 mmol/L (ref 22–32)
Calcium: 8.8 mg/dL — ABNORMAL LOW (ref 8.9–10.3)
Chloride: 101 mmol/L (ref 98–111)
Creatinine, Ser: 1.05 mg/dL — ABNORMAL HIGH (ref 0.44–1.00)
GFR, Estimated: >60 mL/min (ref >60)
Glucose, Bld: 114 mg/dL — ABNORMAL HIGH (ref 70–99)
Potassium: 3.6 mmol/L (ref 3.5–5.1)
Sodium: 136 mmol/L (ref 135–145)
Total Bilirubin: 1.4 mg/dL — ABNORMAL HIGH (ref 0.0–1.2)
Total Protein: 6.2 g/dL — ABNORMAL LOW (ref 6.5–8.1)

## 2023-03-20 LAB — MAGNESIUM: Magnesium: 2 mg/dL (ref 1.7–2.4)

## 2023-03-20 LAB — I-STAT CG4 LACTIC ACID, ED: Lactic Acid, Venous: 2.2 mmol/L (ref 0.5–1.9)

## 2023-03-20 MED ORDER — PROMETHAZINE HCL 25 MG PO TABS: 25.0000 mg | ORAL_TABLET | Freq: Four times a day (QID) | ORAL | 0 refills | Status: DC | PRN

## 2023-03-20 MED ORDER — METOCLOPRAMIDE HCL 5 MG/ML IJ SOLN: 10.0000 mg | Freq: Four times a day (QID) | INTRAMUSCULAR | Status: DC | PRN

## 2023-03-20 MED ORDER — TIRZEPATIDE 10 MG/0.5ML ~~LOC~~ SOAJ: 10.0000 mg | SUBCUTANEOUS | Status: DC

## 2023-03-20 MED ORDER — ONDANSETRON HCL 4 MG PO TABS: 4.0000 mg | ORAL_TABLET | Freq: Three times a day (TID) | ORAL | 0 refills | Status: DC | PRN

## 2023-03-20 MED ORDER — POTASSIUM CHLORIDE CRYS ER 20 MEQ PO TBCR: 20.0000 meq | EXTENDED_RELEASE_TABLET | Freq: Two times a day (BID) | ORAL | 0 refills | Status: DC

## 2023-03-20 MED ORDER — LISINOPRIL 30 MG PO TABS: 30.0000 mg | ORAL_TABLET | Freq: Every day | ORAL | Status: DC | PRN

## 2023-03-20 MED ORDER — ONDANSETRON HCL 4 MG/2ML IJ SOLN: 4.0000 mg | Freq: Four times a day (QID) | INTRAMUSCULAR | Status: DC | PRN

## 2023-03-20 MED ORDER — PANTOPRAZOLE SODIUM 40 MG PO TBEC: 40.0000 mg | DELAYED_RELEASE_TABLET | Freq: Two times a day (BID) | ORAL | 0 refills | Status: DC

## 2023-03-20 NOTE — Discharge Summary (Signed)
 Physician Discharge Summary  [name] NWG:[id number] DOB: July 11, 1970 DOA: 03/19/2023  PCP: Roise Cleaver, MD  Admit date: 03/19/2023 Discharge date: 03/20/2023  Admitted From: Home Disposition: Home  Recommendations for Outpatient Follow-up:  Follow up with PCP in 1 week with repeat CBC/BMP Follow up in ED if symptoms worsen or new appear   Home Health: No Equipment/Devices: None  Discharge Condition: Stable CODE STATUS: Full Diet recommendation: Carb modified  Brief/Interim Summary: 53 year old African-American female with history of class III obesity, essential hypertension, diabetes mellitus type 2, hyperlipidemia who was recently switched from Ozempic  to Mounjaro as an outpatient by PCP and diagnosis of influenza in the ED on 03/11/2023 presented with worsening nausea and vomiting.  On presentation, sodium was 129, potassium of 2.6.  Chest x-ray showed no acute cardiopulmonary disease.  CT of abdomen and pelvis showed cholelithiasis without cholecystitis.  She was treated with IV fluids and potassium supplementation.  Subsequently, her condition has improved.  She feels much better.  Potassium has normalized.  She will be discharged home today if she tolerates advanced diet.  Outpatient follow-up with PCP regarding need to continue Mounjaro.Aaron Aas  Discharge Diagnoses:   Hypokalemia -Presented with vomiting and potassium of 2.6.  Potassium is improved to 3.6 this morning.  Symptomatically much better. -Continue replacement on discharge.  Outpatient follow-up of BMP  Hyponatremia -Sodium 129 on presentation.  Improved to 136 with IV fluids.  Outpatient follow-up  Recent diagnosis of influenza A -Tested positive for influenza A on 03/11/2023.  Continue supportive care.  Outside the window for Tamiflu  Diabetes mellitus type 2 with hyperglycemia -Carb modified diet.  Resume metformin .  Follow-up with PCP regarding timing of resumption of  Mounjaro  Dyspepsia Nausea/vomiting Cholelithiasis without cholecystitis -Improving.  Currently empirically on Protonix .  Continue Protonix  on discharge along with as needed antiemetics. -Imaging as above  Hypertension  hyperlipidemia -Continue statin.  Blood pressure intermittently on the lower side.  Continue home regimen.  Outpatient follow-up with PCP.  Morbid obesity/obesity class III -Outpatient follow-up  Discharge Instructions   Allergies as of 03/20/2023       Reactions   Oxycodone-acetaminophen  Nausea And Vomiting, Other (See Comments)   Percocet [oxycodone-acetaminophen ] Nausea And Vomiting        Medication List     STOP taking these medications    Ozempic  (2 MG/DOSE) 8 MG/3ML Sopn Generic drug: Semaglutide  (2 MG/DOSE)       TAKE these medications    atorvastatin  40 MG tablet Commonly known as: LIPITOR Take 1 tablet (40 mg total) by mouth daily. For cholesterol   cholecalciferol 25 MCG (1000 UNIT) tablet Commonly known as: VITAMIN D3 Take 2,000 Units by mouth daily.   hydrochlorothiazide  25 MG tablet Commonly known as: HYDRODIURIL  Take 0.5-1 tablets (12.5-25 mg total) by mouth daily as needed (swelling). What changed:  how much to take when to take this   levonorgestrel 20 MCG/24HR IUD Commonly known as: MIRENA 1 each by Intrauterine route once.   lisinopril  30 MG tablet Commonly known as: ZESTRIL  Take 1 tablet (30 mg total) by mouth daily as needed (Take if BP is >130/90. She feels like her dose is too high to take because her bp is WNR.).   metFORMIN  500 MG 24 hr tablet Commonly known as: GLUCOPHAGE -XR Take 2 tablets (1,000 mg total) by mouth daily with breakfast.   ondansetron  4 MG tablet Commonly known as: Zofran  Take 1 tablet (4 mg total) by mouth every 8 (eight) hours as needed for nausea or  vomiting.   OneTouch Delica Plus Lancet33G Misc TEST BLOOD SUGAR 3 TIMES  DAILY Dx E11.9   OneTouch Verio Reflect w/Device Kit CHECK  BLOOD SUGAR 3 TIMES  DAILY AS DIRECTED   OneTouch Verio test strip Generic drug: glucose blood TEST BLOOD SUGAR 3 TIMES  DAILY Dx E11.9   pantoprazole  40 MG tablet Commonly known as: PROTONIX  Take 1 tablet (40 mg total) by mouth 2 (two) times daily.   potassium chloride  SA 20 MEQ tablet Commonly known as: KLOR-CON  M Take 1 tablet (20 mEq total) by mouth 2 (two) times daily for 7 days. Start taking on: March 21, 2023   promethazine  25 MG suppository Commonly known as: PHENERGAN  Place 1 suppository (25 mg total) rectally every 6 (six) hours as needed for nausea or vomiting. What changed: Another medication with the same name was added. Make sure you understand how and when to take each.   promethazine  25 MG tablet Commonly known as: PHENERGAN  Take 1 tablet (25 mg total) by mouth every 6 (six) hours as needed for refractory nausea / vomiting. What changed: You were already taking a medication with the same name, and this prescription was added. Make sure you understand how and when to take each.   tirzepatide 10 MG/0.5ML Pen Commonly known as: MOUNJARO Inject 10 mg into the skin once a week. Hold till you speak to your PCP What changed: additional instructions        Follow-up Information     Stacks, Vallorie Gayer, MD. Schedule an appointment as soon as possible for a visit in 1 week(s).   Specialty: Family Medicine Why: with repeat bmp Contact information: [address] [place] [address] [phone]                Allergies  Allergen Reactions   Oxycodone-Acetaminophen  Nausea And Vomiting and Other (See Comments)   Percocet [Oxycodone-Acetaminophen ] Nausea And Vomiting    Consultations: None   Procedures/Studies: CT ABDOMEN PELVIS W CONTRAST Result Date: 03/19/2023 CLINICAL DATA:  Epigastric abdominal pain, fatigue for 1 week EXAM: CT ABDOMEN AND PELVIS WITH CONTRAST TECHNIQUE: Multidetector CT imaging of the abdomen and pelvis was performed using the  standard protocol following bolus administration of intravenous contrast. RADIATION DOSE REDUCTION: This exam was performed according to the departmental dose-optimization program which includes automated exposure control, adjustment of the mA and/or kV according to patient size and/or use of iterative reconstruction technique. CONTRAST:  75mL OMNIPAQUE  IOHEXOL  350 MG/ML SOLN COMPARISON:  03/11/2023 FINDINGS: Lower chest: No acute pleural or parenchymal lung disease. Hepatobiliary: Focal fatty infiltration along the falciform ligament. No other focal liver abnormalities. Multiple small calcified gallstones without evidence of acute cholecystitis. No biliary duct dilation. Pancreas: Unremarkable. No pancreatic ductal dilatation or surrounding inflammatory changes. Spleen: Normal in size without focal abnormality. Adrenals/Urinary Tract: Adrenal glands are unremarkable. Kidneys are normal, without renal calculi, focal lesion, or hydronephrosis. Bladder is unremarkable. Stomach/Bowel: No bowel obstruction or ileus. Normal appendix right lower quadrant. No bowel wall thickening or inflammatory change. Vascular/Lymphatic: Aortic atherosclerosis. No enlarged abdominal or pelvic lymph nodes. Reproductive: IUD again noted within a retroverted uterus. 2 cm simple cyst or dominant follicle right ovary again noted, no specific imaging follow-up recommended. No left adnexal mass. Other: No free fluid or free intraperitoneal gas. No abdominal wall hernia. Musculoskeletal: No acute or destructive bony abnormalities. Reconstructed images demonstrate no additional findings. IMPRESSION: 1. Cholelithiasis without evidence of cholecystitis. 2. Otherwise no acute intra-abdominal or intrapelvic process. 3.  Aortic Atherosclerosis (ICD10-I70.0). Electronically  Signed   By: Bobbye Burrow M.D.   On: 03/19/2023 18:21   DG Chest 2 View Result Date: 03/19/2023 CLINICAL DATA:  Cough and fatigue. EXAM: CHEST - 2 VIEW COMPARISON:  None  Available. FINDINGS: The heart size and mediastinal contours are within normal limits. Both lungs are clear. The visualized skeletal structures are unremarkable. IMPRESSION: No active cardiopulmonary disease. Electronically Signed   By: Aleta Anda M.D.   On: 03/19/2023 14:52   CT ABDOMEN PELVIS W CONTRAST Result Date: 03/11/2023 CLINICAL DATA:  Acute nonlocalized abdominal pain. EXAM: CT ABDOMEN AND PELVIS WITH CONTRAST TECHNIQUE: Multidetector CT imaging of the abdomen and pelvis was performed using the standard protocol following bolus administration of intravenous contrast. RADIATION DOSE REDUCTION: This exam was performed according to the departmental dose-optimization program which includes automated exposure control, adjustment of the mA and/or kV according to patient size and/or use of iterative reconstruction technique. CONTRAST:  75mL OMNIPAQUE  IOHEXOL  350 MG/ML SOLN COMPARISON:  None Available. FINDINGS: Lower chest: Lung bases are clear. Heart is enlarged. No pericardial or pleural effusion. Air in the esophagus can be seen with dysmotility. Hepatobiliary: Liver is decreased in attenuation diffusely. Small stones layer in the gallbladder. No biliary ductal dilatation. Pancreas: Negative. Spleen: Negative. Adrenals/Urinary Tract: Adrenal glands and kidneys are unremarkable. Ureters are decompressed. Bladder is low in volume. Stomach/Bowel: Stomach, small bowel, appendix and colon are unremarkable. Vascular/Lymphatic: Atherosclerotic calcification of the aorta. No pathologically enlarged lymph nodes. Reproductive: Intrauterine contraceptive device in place. 2.5 cm simple appearing cyst in the right adnexa. No follow-up imaging recommended. Note: This recommendation does not apply to premenarchal patients and to those with increased risk (genetic, family history, elevated tumor markers or other high-risk factors) of ovarian cancer. Reference: JACR 2020 Feb; 17(2):248-254. Other: No free fluid.   Mesenteries and peritoneum are unremarkable. Musculoskeletal: Degenerative changes in the spine. IMPRESSION: 1. No findings to explain the patient's pain. 2. Hepatic steatosis. 3. Cholelithiasis. 4.  Aortic atherosclerosis (ICD10-I70.0). Electronically Signed   By: Shearon Denis M.D.   On: 03/11/2023 20:18      Subjective: Patient seen and examined at bedside.  Feels much better and feels that she might be ready to go home today if she tolerates diet.  No fever, chest pain or worsening shortness of breath reported  Discharge Exam: Vitals:   03/20/23 0755 03/20/23 0800  BP:  113/81  Pulse:  (!) 105  Resp:  17  Temp: 98.2 F (36.8 C)   SpO2:  100%    General: Pt is alert, awake, not in acute distress.  On room air Cardiovascular: Mild intermittent tachycardia present, S1/S2 + Respiratory: bilateral decreased breath sounds at bases Abdominal: Soft, morbidly obese, NT, ND, bowel sounds + Extremities: no edema, no cyanosis    The results of significant diagnostics from this hospitalization (including imaging, microbiology, ancillary and laboratory) are listed below for reference.     Microbiology: No results found for this or any previous visit (from the past 240 hours).   Labs: BNP (last 3 results) No results for input(s): "BNP" in the last 8760 hours. Basic Metabolic Panel: Recent Labs  Lab 03/14/23 1910 03/15/23 0212 03/19/23 1429 03/20/23 0603  NA 135  --  129* 136  K 2.8*  --  2.6* 3.6  CL 95*  --  90* 101  CO2 26  --  21* 24  GLUCOSE 145*  --  158* 114*  BUN 15  --  14 9  CREATININE 0.75  --  1.13*  1.05*  CALCIUM  8.8*  --  9.7 8.8*  MG  --  2.0 2.0 2.0   Liver Function Tests: Recent Labs  Lab 03/14/23 1910 03/19/23 1429 03/20/23 0603  AST 23 26 21   ALT 26 32 26  ALKPHOS 71 60 51  BILITOT 0.8 1.3* 1.4*  PROT 7.9 7.3 6.2*  ALBUMIN 4.4 3.8 3.3*   Recent Labs  Lab 03/14/23 1910 03/19/23 1429  LIPASE 41 59*   No results for input(s): "AMMONIA" in  the last 168 hours. CBC: Recent Labs  Lab 03/14/23 1910 03/19/23 1429  WBC 9.9 11.4*  NEUTROABS  --  7.0  HGB 13.9 14.7  HCT 41.7 45.1  MCV 73.4* 74.1*  PLT 339 403*   Cardiac Enzymes: No results for input(s): "CKTOTAL", "CKMB", "CKMBINDEX", "TROPONINI" in the last 168 hours. BNP: Invalid input(s): "POCBNP" CBG: Recent Labs  Lab 03/19/23 2352 03/20/23 0801  GLUCAP 83 109*   D-Dimer No results for input(s): "DDIMER" in the last 72 hours. Hgb A1c Recent Labs    03/19/23 1429  HGBA1C 7.5*   Lipid Profile No results for input(s): "CHOL", "HDL", "LDLCALC", "TRIG", "CHOLHDL", "LDLDIRECT" in the last 72 hours. Thyroid function studies No results for input(s): "TSH", "T4TOTAL", "T3FREE", "THYROIDAB" in the last 72 hours.  Invalid input(s): "FREET3" Anemia work up No results for input(s): "VITAMINB12", "FOLATE", "FERRITIN", "TIBC", "IRON", "RETICCTPCT" in the last 72 hours. Urinalysis    Component Value Date/Time   COLORURINE YELLOW 03/19/2023 1436   APPEARANCEUR CLEAR 03/19/2023 1436   APPEARANCEUR Hazy (A) 05/12/2022 1402   LABSPEC 1.015 03/19/2023 1436   PHURINE 6.5 03/19/2023 1436   GLUCOSEU NEGATIVE 03/19/2023 1436   HGBUR TRACE (A) 03/19/2023 1436   BILIRUBINUR MODERATE (A) 03/19/2023 1436   BILIRUBINUR Negative 05/12/2022 1402   KETONESUR >80 (A) 03/19/2023 1436   PROTEINUR NEGATIVE 03/19/2023 1436   NITRITE NEGATIVE 03/19/2023 1436   LEUKOCYTESUR NEGATIVE 03/19/2023 1436   Sepsis Labs Recent Labs  Lab 03/14/23 1910 03/19/23 1429  WBC 9.9 11.4*   Microbiology No results found for this or any previous visit (from the past 240 hours).   Time coordinating discharge: 35 minutes  SIGNED:   Audria Leather, MD  [facility] 03/20/2023, 11:21 AM

## 2023-03-20 NOTE — ED Notes (Signed)
Patient ambulated to the restroom without any difficulty

## 2023-04-04 ENCOUNTER — Encounter: Payer: Self-pay | Admitting: Family Medicine

## 2023-04-04 ENCOUNTER — Ambulatory Visit (INDEPENDENT_AMBULATORY_CARE_PROVIDER_SITE_OTHER): Payer: No Typology Code available for payment source | Admitting: Family Medicine

## 2023-04-04 VITALS — BP 105/71 | HR 100 | Temp 97.3°F | Ht 64.0 in | Wt 254.0 lb

## 2023-04-04 DIAGNOSIS — Z0001 Encounter for general adult medical examination with abnormal findings: Secondary | ICD-10-CM | POA: Diagnosis not present

## 2023-04-04 DIAGNOSIS — E1159 Type 2 diabetes mellitus with other circulatory complications: Secondary | ICD-10-CM | POA: Diagnosis not present

## 2023-04-04 DIAGNOSIS — D563 Thalassemia minor: Secondary | ICD-10-CM | POA: Diagnosis not present

## 2023-04-04 DIAGNOSIS — Z7984 Long term (current) use of oral hypoglycemic drugs: Secondary | ICD-10-CM

## 2023-04-04 DIAGNOSIS — I152 Hypertension secondary to endocrine disorders: Secondary | ICD-10-CM | POA: Diagnosis not present

## 2023-04-04 DIAGNOSIS — Z0000 Encounter for general adult medical examination without abnormal findings: Secondary | ICD-10-CM

## 2023-04-04 MED ORDER — PANTOPRAZOLE SODIUM 40 MG PO TBEC: 40.0000 mg | DELAYED_RELEASE_TABLET | Freq: Every day | ORAL | Status: AC

## 2023-04-04 NOTE — Progress Notes (Unsigned)
 Subjective:  Patient ID: [name], female    DOB: [birth year]  Age: 53 y.o. MRN: [id number]  CC: Annual Exam (Pt had the 1/28  Flu she she get the vaccine today. )   HPI [name] presents for Annual Physical      03/01/2023    1:13 PM 08/15/2022    1:55 PM 05/12/2022    1:52 PM  Depression screen PHQ 2/9  Decreased Interest 0 0 0  Down, Depressed, Hopeless 0 0 0  PHQ - 2 Score 0 0 0  Altered sleeping 0    Tired, decreased energy 0    Change in appetite 0    Feeling bad or failure about yourself  0    Trouble concentrating 0    Moving slowly or fidgety/restless 0    Suicidal thoughts 0    PHQ-9 Score 0    Difficult doing work/chores Not difficult at all      History Jonae has a past medical history of Abnormal Pap smear (2007), Complication of anesthesia, Cystic fibrosis carrier, FHx: heart disease, FHx: hypertension, FHx: scoliosis, H/O candidiasis, H/O chlamydia infection (2006), UTI (urinary tract infection), Hyperlipidemia, Hypertension, Increased BMI, Obesity, and Vulvitis (04/27/06).   She has a past surgical history that includes Wisdom tooth extraction and Cesarean section.   Her family history includes Alcohol abuse in her maternal uncle; Diabetes in her mother and paternal grandmother; Heart disease in her mother; Hypertension in her mother and sister; Mental illness in her paternal grandmother.She reports that she has never smoked. She has never used smokeless tobacco. She reports that she does not drink alcohol and does not use drugs.    ROS Review of Systems  Constitutional: Negative.   HENT:  Negative for congestion.   Eyes:  Negative for visual disturbance.  Respiratory:  Negative for shortness of breath.   Cardiovascular:  Positive for leg swelling (a little). Negative for chest pain.  Gastrointestinal:  Negative for abdominal pain, constipation, diarrhea, nausea (resolvd during hospitalization) and vomiting.  Genitourinary:  Negative for difficulty  urinating.  Musculoskeletal:  Negative for arthralgias and myalgias.  Neurological:  Negative for headaches.  Psychiatric/Behavioral:  Negative for sleep disturbance.     Objective:  BP 105/71   Pulse 100   Temp (!) 97.3 F (36.3 C)   Ht 5\' 4"  (1.626 m)   Wt 254 lb (115.2 kg)   SpO2 98%   BMI 43.60 kg/m   BP Readings from Last 3 Encounters:  04/04/23 105/71  03/20/23 (!) 96/45  03/15/23 (!) 153/84    Wt Readings from Last 3 Encounters:  04/04/23 254 lb (115.2 kg)  03/19/23 242 lb (109.8 kg)  03/11/23 266 lb 1.5 oz (120.7 kg)     Physical Exam Constitutional:      General: She is not in acute distress.    Appearance: She is well-developed.  HENT:     Head: Normocephalic and atraumatic.  Eyes:     Conjunctiva/sclera: Conjunctivae normal.     Pupils: Pupils are equal, round, and reactive to light.  Neck:     Thyroid: No thyromegaly.  Cardiovascular:     Rate and Rhythm: Normal rate and regular rhythm.     Heart sounds: Normal heart sounds. No murmur heard. Pulmonary:     Effort: Pulmonary effort is normal. No respiratory distress.     Breath sounds: Normal breath sounds. No wheezing or rales.  Abdominal:     General: Bowel sounds are normal. There is  no distension.     Palpations: Abdomen is soft.     Tenderness: There is no abdominal tenderness.  Musculoskeletal:        General: Normal range of motion.     Cervical back: Normal range of motion and neck supple.  Lymphadenopathy:     Cervical: No cervical adenopathy.  Skin:    General: Skin is warm and dry.  Neurological:     Mental Status: She is alert and oriented to person, place, and time.  Psychiatric:        Behavior: Behavior normal.        Thought Content: Thought content normal.        Judgment: Judgment normal.       Assessment & Plan:   Trianna was seen today for annual exam.  Diagnoses and all orders for this visit:  Well adult exam -     CBC with Differential/Platelet -      CMP14+EGFR -     Magnesium  Thalassemia alpha carrier -     CBC with Differential/Platelet  Hypertension associated with diabetes (HCC) -     CBC with Differential/Platelet -     CMP14+EGFR -     Magnesium  Other orders -     pantoprazole (PROTONIX) 40 MG tablet; Take 1 tablet (40 mg total) by mouth daily.       I have discontinued Havannah L. Kondracki's promethazine, ondansetron, and promethazine. I have also changed her pantoprazole. Additionally, I am having her maintain her levonorgestrel, cholecalciferol, OneTouch Delica Plus Lancet33G, OneTouch Verio, OneTouch Verio Reflect, hydrochlorothiazide, metFORMIN, atorvastatin, tirzepatide, potassium chloride SA, and lisinopril.  Allergies as of 04/04/2023       Reactions   Oxycodone-acetaminophen Nausea And Vomiting, Other (See Comments)   Percocet [oxycodone-acetaminophen] Nausea And Vomiting        Medication List        Accurate as of April 04, 2023 11:59 PM. If you have any questions, ask your nurse or doctor.          STOP taking these medications    ondansetron 4 MG tablet Commonly known as: Zofran Stopped by: Ieisha Gao   promethazine 25 MG suppository Commonly known as: PHENERGAN Stopped by: Thermon Zulauf   promethazine 25 MG tablet Commonly known as: PHENERGAN Stopped by: Elis Rawlinson       TAKE these medications    atorvastatin 40 MG tablet Commonly known as: LIPITOR Take 1 tablet (40 mg total) by mouth daily. For cholesterol   cholecalciferol 25 MCG (1000 UNIT) tablet Commonly known as: VITAMIN D3 Take 2,000 Units by mouth daily.   hydrochlorothiazide 25 MG tablet Commonly known as: HYDRODIURIL Take 0.5-1 tablets (12.5-25 mg total) by mouth daily as needed (swelling). What changed:  how much to take when to take this   levonorgestrel 20 MCG/24HR IUD Commonly known as: MIRENA 1 each by Intrauterine route once.   lisinopril 30 MG tablet Commonly known as: ZESTRIL Take 1 tablet  (30 mg total) by mouth daily as needed (Take if BP is >130/90. She feels like her dose is too high to take because her bp is WNR.).   metFORMIN 500 MG 24 hr tablet Commonly known as: GLUCOPHAGE-XR Take 2 tablets (1,000 mg total) by mouth daily with breakfast.   OneTouch Delica Plus Lancet33G Misc TEST BLOOD SUGAR 3 TIMES  DAILY Dx E11.9   OneTouch Verio Reflect w/Device Kit CHECK BLOOD SUGAR 3 TIMES  DAILY AS DIRECTED   OneTouch Verio test strip  Generic drug: glucose blood TEST BLOOD SUGAR 3 TIMES  DAILY Dx E11.9   pantoprazole 40 MG tablet Commonly known as: PROTONIX Take 1 tablet (40 mg total) by mouth daily. What changed: when to take this Changed by: Desa Rech   potassium chloride SA 20 MEQ tablet Commonly known as: KLOR-CON M Take 1 tablet (20 mEq total) by mouth 2 (two) times daily for 7 days.   tirzepatide 10 MG/0.5ML Pen Commonly known as: MOUNJARO Inject 10 mg into the skin once a week. Hold till you speak to your PCP         Follow-up: No follow-ups on file.  Mechele Claude, M.D.

## 2023-04-05 LAB — CMP14+EGFR
ALT: 24 IU/L (ref 0–32)
AST: 20 IU/L (ref 0–40)
Albumin: 4.2 g/dL (ref 3.8–4.9)
Alkaline Phosphatase: 87 IU/L (ref 44–121)
BUN/Creatinine Ratio: 17 (ref 9–23)
BUN: 16 mg/dL (ref 6–24)
Bilirubin Total: 0.6 mg/dL (ref 0.0–1.2)
CO2: 22 mmol/L (ref 20–29)
Calcium: 9.8 mg/dL (ref 8.7–10.2)
Chloride: 97 mmol/L (ref 96–106)
Creatinine, Ser: 0.96 mg/dL (ref 0.57–1.00)
Globulin, Total: 2.8 g/dL (ref 1.5–4.5)
Glucose: 137 mg/dL — ABNORMAL HIGH (ref 70–99)
Potassium: 4.3 mmol/L (ref 3.5–5.2)
Sodium: 136 mmol/L (ref 134–144)
Total Protein: 7 g/dL (ref 6.0–8.5)
eGFR: 71 mL/min/{1.73_m2} (ref >59)

## 2023-04-05 LAB — CBC WITH DIFFERENTIAL/PLATELET
Basophils Absolute: 0 10*3/uL (ref 0.0–0.2)
Basos: 0 %
EOS (ABSOLUTE): 0.1 10*3/uL (ref 0.0–0.4)
Eos: 2 %
Hematocrit: 40.2 % (ref 34.0–46.6)
Hemoglobin: 12.7 g/dL (ref 11.1–15.9)
Immature Grans (Abs): 0 10*3/uL (ref 0.0–0.1)
Immature Granulocytes: 0 %
Lymphocytes Absolute: 2.6 10*3/uL (ref 0.7–3.1)
Lymphs: 29 %
MCH: 24.3 pg — ABNORMAL LOW (ref 26.6–33.0)
MCHC: 31.6 g/dL (ref 31.5–35.7)
MCV: 77 fL — ABNORMAL LOW (ref 79–97)
Monocytes Absolute: 0.7 10*3/uL (ref 0.1–0.9)
Monocytes: 8 %
Neutrophils Absolute: 5.4 10*3/uL (ref 1.4–7.0)
Neutrophils: 61 %
Platelets: 397 10*3/uL (ref 150–450)
RBC: 5.22 x10E6/uL (ref 3.77–5.28)
RDW: 14.4 % (ref 11.7–15.4)
WBC: 9 10*3/uL (ref 3.4–10.8)

## 2023-04-05 LAB — MAGNESIUM: Magnesium: 1.8 mg/dL (ref 1.6–2.3)

## 2023-04-07 ENCOUNTER — Encounter: Payer: Self-pay | Admitting: Family Medicine

## 2023-04-07 NOTE — Progress Notes (Signed)
Hello  Judianne,    Your lab result is normal and/or stable.Some minor variations that are not significant are commonly marked abnormal, but do not represent any medical problem for you.   Best regards,  Elaya Droege, M.D.

## 2023-04-13 ENCOUNTER — Other Ambulatory Visit: Payer: Self-pay | Admitting: Family Medicine

## 2023-06-05 ENCOUNTER — Telehealth: Payer: Self-pay

## 2023-06-05 NOTE — Telephone Encounter (Signed)
 Pharmacy Patient Advocate Encounter   Received notification from CoverMyMeds that prior authorization for Ozempic  (0.25 or 0.5 MG/DOSE) 2MG /3ML pen-injectors is due for renewal.   Insurance verification completed.   The patient is insured through [facility].  Action: Medication has been discontinued. Archived Key: [id number]

## 2023-07-04 ENCOUNTER — Ambulatory Visit: Payer: No Typology Code available for payment source | Admitting: Family Medicine

## 2024-01-03 ENCOUNTER — Other Ambulatory Visit: Payer: Self-pay | Admitting: Family Medicine

## 2024-01-03 DIAGNOSIS — E1169 Type 2 diabetes mellitus with other specified complication: Secondary | ICD-10-CM

## 2024-01-23 ENCOUNTER — Ambulatory Visit: Admitting: Family Medicine

## 2024-01-23 ENCOUNTER — Ambulatory Visit: Payer: Self-pay | Admitting: Family Medicine

## 2024-01-23 ENCOUNTER — Encounter: Payer: Self-pay | Admitting: Family Medicine

## 2024-01-23 VITALS — BP 126/80 | HR 97 | Temp 97.9°F | Ht 64.0 in | Wt 250.0 lb

## 2024-01-23 DIAGNOSIS — H539 Unspecified visual disturbance: Secondary | ICD-10-CM

## 2024-01-23 DIAGNOSIS — E1169 Type 2 diabetes mellitus with other specified complication: Secondary | ICD-10-CM

## 2024-01-23 DIAGNOSIS — Z7984 Long term (current) use of oral hypoglycemic drugs: Secondary | ICD-10-CM

## 2024-01-23 DIAGNOSIS — Z23 Encounter for immunization: Secondary | ICD-10-CM

## 2024-01-23 DIAGNOSIS — Z7985 Long-term (current) use of injectable non-insulin antidiabetic drugs: Secondary | ICD-10-CM

## 2024-01-23 LAB — BAYER DCA HB A1C WAIVED: HB A1C (BAYER DCA - WAIVED): 6.7 % — ABNORMAL HIGH (ref 4.8–5.6)

## 2024-01-23 MED ORDER — LANCETS MISC: 1.0000 | 0 refills | Status: DC

## 2024-01-23 MED ORDER — LANCET DEVICE MISC: 1.0000 | Freq: Three times a day (TID) | 0 refills | Status: DC

## 2024-01-23 MED ORDER — BLOOD GLUCOSE TEST VI STRP: 1.0000 | ORAL_STRIP | Freq: Three times a day (TID) | 0 refills | Status: DC

## 2024-01-23 MED ORDER — BLOOD GLUCOSE MONITORING SUPPL DEVI: 1.0000 | Freq: Three times a day (TID) | 0 refills | Status: AC

## 2024-01-23 NOTE — Progress Notes (Signed)
 Subjective:  Patient ID: [name], female    DOB: 1970/09/09  Age: 53 y.o. MRN: [id number]  CC: Diabetes (Eyes would not focus yesterday. Pt reports this has not happened before or happened again since then. Lasted during her workday (working on computer)./)   HPI  Discussed the use of AI scribe software for clinical note transcription with the patient, who gave verbal consent to proceed.  History of Present Illness [name] is a 53 year old female with diabetes who presents with a sudden episode of blurry vision.  She experienced a sudden onset of blurry vision yesterday while working, which persisted throughout the day. She describes it as 'like my prescription changed' and notes that it was blurry even with her glasses on. The blurriness resolved by the next morning. No eye pain, sensitivity to light, or previous similar episodes. She checked her blood sugar and blood pressure during the episode, both of which were normal.  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 She tried using over-the-counter eye drops, which seemed to help slightly.  Her diabetes is currently managed with Mounjaro  and metformin , and she reports an improvement in her A1c from 7.5% in February to 6.7% currently. 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 She also takes lisinopril  and hydrochlorothiazide  for blood pressure management, with recent readings around 110/71 mmHg.  She works full-time and also does [employer], which she enjoys for the physical activity it provides.          03/01/2023    1:13 PM 08/15/2022    1:55 PM 05/12/2022    1:52 PM  Depression screen PHQ 2/9  Decreased Interest 0 0 0  Down, Depressed, Hopeless 0 0 0  PHQ - 2 Score 0 0 0  Altered sleeping 0    Tired, decreased energy 0    Change in appetite 0    Feeling bad or failure about yourself  0    Trouble  concentrating 0    Moving slowly or fidgety/restless 0    Suicidal thoughts 0    PHQ-9 Score 0     Difficult doing work/chores Not difficult at all       Data saved with a previous flowsheet row definition    History [name] has a past medical history of Abnormal Pap smear (2007), Complication of anesthesia, Cystic fibrosis carrier, FHx: heart disease, FHx: hypertension, FHx: scoliosis, H/O candidiasis, H/O chlamydia infection (2006), UTI (urinary tract infection), Hyperlipidemia, Hypertension, Increased BMI, Obesity, and Vulvitis (04/27/06).   She has a past surgical history that includes Wisdom tooth extraction and Cesarean section.   Her family history includes Alcohol abuse in her maternal uncle; Diabetes in her mother and paternal grandmother; Heart disease in her mother; Hypertension in her mother and sister; Mental illness in her paternal grandmother.She reports that she has never smoked. She has never used smokeless tobacco. She reports that she does not drink alcohol and does not use drugs.    ROS Review of Systems  Constitutional: Negative.   HENT:  Negative for congestion.   Eyes:  Positive for visual disturbance.  Respiratory:  Negative for shortness of breath.   Cardiovascular:  Negative for chest pain.  Gastrointestinal:  Negative for abdominal pain, constipation, diarrhea, nausea and vomiting.  Genitourinary:  Negative for difficulty urinating.  Musculoskeletal:  Negative for arthralgias and myalgias.  Neurological:  Negative for headaches.  Psychiatric/Behavioral:  Negative  for sleep disturbance.     Objective:  BP 126/80   Pulse 97   Temp 97.9 F (36.6 C)   Ht 5' 4 (1.626 m)   Wt 250 lb (113.4 kg)   SpO2 100%   BMI 42.91 kg/m   BP Readings from Last 3 Encounters:  01/23/24 126/80  04/04/23 105/71  03/20/23 (!) 96/45    Wt Readings from Last 3 Encounters:  01/23/24 250 lb (113.4 kg)  04/04/23 254 lb (115.2 kg)  03/19/23 242 lb (109.8 kg)      Physical Exam Physical Exam GENERAL: Alert, cooperative, well developed, no acute distress. HEENT: Normocephalic, normal oropharynx, moist mucous membranes, eyes not excessively dry. CHEST: Clear to auscultation bilaterally, no wheezes, rhonchi, or crackles. CARDIOVASCULAR: Normal heart rate and rhythm, S1 and S2 normal without murmurs. ABDOMEN: Soft, non-tender, non-distended, without organomegaly, normal bowel sounds. EXTREMITIES: No cyanosis or edema. NEUROLOGICAL: Cranial nerves grossly intact, moves all extremities without gross motor or sensory deficit.   Assessment & Plan:  Type 2 diabetes mellitus with other specified complication, without long-term current use of insulin  (HCC) -     Bayer DCA Hb A1c Waived -     Microalbumin / creatinine urine ratio  Encounter for immunization -     Flu vaccine trivalent PF, 6mos and older(Flulaval,Afluria,Fluarix,Fluzone)  Transient vision disturbance of both eyes  Other orders -     Blood Glucose Monitoring Suppl; 1 each by Does not apply route in the morning, at noon, and at bedtime. May substitute to any manufacturer covered by patient's insurance.  Dispense: 1 each; Refill: 0 -     Blood Glucose Test; 1 each by In Vitro route in the morning, at noon, and at bedtime. May substitute to any manufacturer covered by patient's insurance.  Dispense: 100 strip; Refill: 0 -     Lancet Device; 1 each by Does not apply route in the morning, at noon, and at bedtime. May substitute to any manufacturer covered by patient's insurance.  Dispense: 1 each; Refill: 0 -     Lancets; 1 each by Does not apply route as directed. Dispense based on patient and insurance preference. Use up to four times daily as directed. (FOR ICD-10 E10.9, E11.9).  Dispense: 100 each; Refill: 0    Assessment and Plan Assessment & Plan Transient visual disturbance likely related to environmental exposure   She experienced a transient visual disturbance yesterday, likely  due to exposure to cold air and wind, causing muscle spasm or dry eyes. Symptoms resolved by the next morning without pain or light sensitivity. Advised to wear eye protection such as goggles or sunglasses when exposed to cold and wind. Recommended using moisture drops or warm saline drops to alleviate dry eyes.  Type 2 diabetes mellitus   Her diabetes is well-controlled with an A1c of 6.7, improved from 7.5. Current management includes Mounjaro  and metformin . Increased physical activity through work has contributed to improved glycemic control. Continue Mounjaro  and metformin  as prescribed. A new glucose monitor is prescribed through [facility]. Schedule A1c testing every three months.  Hypertension   Her hypertension is well-controlled with a recent blood pressure reading of 110/71. Current medication regimen includes lisinopril  and hydrochlorothiazide . Continue lisinopril  and hydrochlorothiazide  as prescribed. Monitor blood pressure regularly.  Early cataracts   Early cataracts were noted during examination. No immediate intervention is required as vision is currently stable.       Follow-up: No follow-ups on file.  Butler Der, M.D.

## 2024-01-24 ENCOUNTER — Ambulatory Visit: Admitting: Nurse Practitioner

## 2024-01-24 MED ORDER — BLOOD GLUCOSE MONITORING SUPPL DEVI: 0 refills | Status: DC

## 2024-01-24 MED ORDER — LANCET DEVICE MISC: 0 refills | Status: AC

## 2024-01-24 MED ORDER — BLOOD GLUCOSE TEST VI STRP: ORAL_STRIP | 3 refills | Status: DC

## 2024-01-24 NOTE — Progress Notes (Signed)
 Fax from [facility] pharmacy RE: directions for BS machine, lancets & test strips not matching Updated directions and resent.

## 2024-01-24 NOTE — Addendum Note (Signed)
 Addended by: Malik Paar D on: 01/24/2024 11:40 AM   Modules accepted: Orders

## 2024-02-26 ENCOUNTER — Ambulatory Visit: Admitting: Family Medicine

## 2024-03-04 ENCOUNTER — Encounter: Payer: Self-pay | Admitting: Family Medicine

## 2024-03-05 ENCOUNTER — Other Ambulatory Visit: Payer: Self-pay

## 2024-03-05 DIAGNOSIS — I152 Hypertension secondary to endocrine disorders: Secondary | ICD-10-CM

## 2024-03-05 MED ORDER — LISINOPRIL 30 MG PO TABS: 30.0000 mg | ORAL_TABLET | Freq: Every day | ORAL | 0 refills | Status: AC | PRN

## 2024-03-06 ENCOUNTER — Telehealth: Payer: Self-pay | Admitting: *Deleted

## 2024-03-06 MED ORDER — ACCU-CHEK SOFTCLIX LANCETS MISC: 3 refills | Status: AC

## 2024-03-06 MED ORDER — ACCU-CHEK GUIDE TEST VI STRP: ORAL_STRIP | 3 refills | Status: AC

## 2024-03-06 MED ORDER — ACCU-CHEK GUIDE ME W/DEVICE KIT: PACK | 0 refills | Status: AC

## 2024-03-06 NOTE — Telephone Encounter (Signed)
 Fax from [employer] [facility] [employer] for specific diabetic supplies, sending Accu-chek guide

## 2024-04-22 ENCOUNTER — Ambulatory Visit: Admitting: Family Medicine
